# Patient Record
Sex: Male | Born: 1950 | Race: White | Hispanic: No | Marital: Married | Smoking: Never smoker
Health system: Southern US, Community
[De-identification: ages and names within clinical notes are randomized; demographics above are authoritative.]

## PROBLEM LIST (undated history)

## (undated) DIAGNOSIS — R569 Unspecified convulsions: Secondary | ICD-10-CM

## (undated) DIAGNOSIS — M1990 Unspecified osteoarthritis, unspecified site: Secondary | ICD-10-CM

## (undated) DIAGNOSIS — F329 Major depressive disorder, single episode, unspecified: Secondary | ICD-10-CM

## (undated) DIAGNOSIS — I10 Essential (primary) hypertension: Secondary | ICD-10-CM

## (undated) DIAGNOSIS — F79 Unspecified intellectual disabilities: Secondary | ICD-10-CM

## (undated) DIAGNOSIS — E119 Type 2 diabetes mellitus without complications: Secondary | ICD-10-CM

## (undated) DIAGNOSIS — R51 Headache: Secondary | ICD-10-CM

## (undated) DIAGNOSIS — F32A Depression, unspecified: Secondary | ICD-10-CM

## (undated) DIAGNOSIS — C801 Malignant (primary) neoplasm, unspecified: Secondary | ICD-10-CM

## (undated) HISTORY — PX: HERNIA REPAIR: SHX51

---

## 1999-02-25 ENCOUNTER — Ambulatory Visit (HOSPITAL_COMMUNITY): Admission: RE | Admit: 1999-02-25 | Discharge: 1999-02-25 | Payer: Self-pay | Admitting: Emergency Medicine

## 2000-07-08 ENCOUNTER — Emergency Department (HOSPITAL_COMMUNITY): Admission: EM | Admit: 2000-07-08 | Discharge: 2000-07-08 | Payer: Self-pay | Admitting: Emergency Medicine

## 2002-04-17 ENCOUNTER — Encounter: Admission: RE | Admit: 2002-04-17 | Discharge: 2002-07-16 | Payer: Self-pay | Admitting: Emergency Medicine

## 2002-12-10 ENCOUNTER — Encounter: Admission: RE | Admit: 2002-12-10 | Discharge: 2002-12-10 | Payer: Self-pay | Admitting: Emergency Medicine

## 2002-12-10 ENCOUNTER — Encounter: Payer: Self-pay | Admitting: Emergency Medicine

## 2004-02-03 ENCOUNTER — Encounter: Admission: RE | Admit: 2004-02-03 | Discharge: 2004-02-03 | Payer: Self-pay | Admitting: Emergency Medicine

## 2004-06-07 ENCOUNTER — Encounter: Admission: RE | Admit: 2004-06-07 | Discharge: 2004-06-07 | Payer: Self-pay | Admitting: Emergency Medicine

## 2008-12-03 ENCOUNTER — Emergency Department (HOSPITAL_COMMUNITY): Admission: EM | Admit: 2008-12-03 | Discharge: 2008-12-03 | Payer: Self-pay | Admitting: Emergency Medicine

## 2009-06-22 ENCOUNTER — Emergency Department (HOSPITAL_COMMUNITY): Admission: EM | Admit: 2009-06-22 | Discharge: 2009-06-22 | Payer: Self-pay | Admitting: Emergency Medicine

## 2010-08-30 LAB — COMPREHENSIVE METABOLIC PANEL
ALT: 19 U/L (ref 0–53)
AST: 23 U/L (ref 0–37)
Albumin: 4.5 g/dL (ref 3.5–5.2)
Calcium: 10 mg/dL (ref 8.4–10.5)
GFR calc Af Amer: >60 mL/min (ref >60)
Sodium: 135 mEq/L (ref 135–145)
Total Protein: 8 g/dL (ref 6.0–8.3)

## 2010-08-30 LAB — CBC
MCHC: 32.7 g/dL (ref 30.0–36.0)
Platelets: 258 10*3/uL (ref 150–400)
RBC: 5.02 MIL/uL (ref 4.22–5.81)
RDW: 17.5 % — ABNORMAL HIGH (ref 11.5–15.5)

## 2010-08-30 LAB — DIFFERENTIAL
Eosinophils Absolute: 0.1 10*3/uL (ref 0.0–0.7)
Eosinophils Relative: 2 % (ref 0–5)
Lymphocytes Relative: 39 % (ref 12–46)
Lymphs Abs: 3.1 10*3/uL (ref 0.7–4.0)
Monocytes Relative: 11 % (ref 3–12)

## 2010-08-30 LAB — URINALYSIS, ROUTINE W REFLEX MICROSCOPIC
Ketones, ur: NEGATIVE mg/dL
Nitrite: NEGATIVE
Protein, ur: NEGATIVE mg/dL
Urobilinogen, UA: 0.2 mg/dL (ref 0.0–1.0)
pH: 6.5 (ref 5.0–8.0)

## 2010-08-30 LAB — POCT I-STAT, CHEM 8
Calcium, Ion: 1.14 mmol/L (ref 1.12–1.32)
Chloride: 106 mEq/L (ref 96–112)
HCT: 43 % (ref 39.0–52.0)
Potassium: 3.9 mEq/L (ref 3.5–5.1)

## 2012-03-30 ENCOUNTER — Emergency Department (HOSPITAL_COMMUNITY)
Admission: EM | Admit: 2012-03-30 | Discharge: 2012-03-30 | Disposition: A | Discharge status: Home / Self Care | Payer: Medicare Other | Source: Home / Self Care | Attending: Emergency Medicine | Admitting: Emergency Medicine

## 2012-03-30 ENCOUNTER — Emergency Department (HOSPITAL_COMMUNITY): Payer: Medicare Other

## 2012-03-30 ENCOUNTER — Encounter (HOSPITAL_COMMUNITY): Payer: Self-pay | Admitting: Emergency Medicine

## 2012-03-30 DIAGNOSIS — E119 Type 2 diabetes mellitus without complications: Secondary | ICD-10-CM | POA: Insufficient documentation

## 2012-03-30 DIAGNOSIS — R569 Unspecified convulsions: Secondary | ICD-10-CM

## 2012-03-30 DIAGNOSIS — M129 Arthropathy, unspecified: Secondary | ICD-10-CM | POA: Insufficient documentation

## 2012-03-30 DIAGNOSIS — F79 Unspecified intellectual disabilities: Secondary | ICD-10-CM | POA: Insufficient documentation

## 2012-03-30 DIAGNOSIS — F3289 Other specified depressive episodes: Secondary | ICD-10-CM | POA: Insufficient documentation

## 2012-03-30 DIAGNOSIS — Z79899 Other long term (current) drug therapy: Secondary | ICD-10-CM | POA: Insufficient documentation

## 2012-03-30 DIAGNOSIS — I10 Essential (primary) hypertension: Secondary | ICD-10-CM | POA: Insufficient documentation

## 2012-03-30 DIAGNOSIS — F329 Major depressive disorder, single episode, unspecified: Secondary | ICD-10-CM | POA: Insufficient documentation

## 2012-03-30 LAB — URINALYSIS, ROUTINE W REFLEX MICROSCOPIC
Hgb urine dipstick: NEGATIVE
Nitrite: NEGATIVE
Specific Gravity, Urine: 1.019 (ref 1.005–1.030)
Urobilinogen, UA: 1 mg/dL (ref 0.0–1.0)
pH: 5.5 (ref 5.0–8.0)

## 2012-03-30 LAB — CBC WITH DIFFERENTIAL/PLATELET
Basophils Absolute: 0 10*3/uL (ref 0.0–0.1)
Basophils Relative: 0 % (ref 0–1)
Eosinophils Relative: 1 % (ref 0–5)
HCT: 39.8 % (ref 39.0–52.0)
Hemoglobin: 12.8 g/dL — ABNORMAL LOW (ref 13.0–17.0)
Lymphocytes Relative: 32 % (ref 12–46)
MCHC: 32.2 g/dL (ref 30.0–36.0)
MCV: 82.1 fL (ref 78.0–100.0)
Monocytes Absolute: 1.1 10*3/uL — ABNORMAL HIGH (ref 0.1–1.0)
Monocytes Relative: 12 % (ref 3–12)
RDW: 16.1 % — ABNORMAL HIGH (ref 11.5–15.5)

## 2012-03-30 LAB — BASIC METABOLIC PANEL
Chloride: 99 mEq/L (ref 96–112)
GFR calc non Af Amer: >90 mL/min (ref >90)
Glucose, Bld: 183 mg/dL — ABNORMAL HIGH (ref 70–99)
Potassium: 4.7 mEq/L (ref 3.5–5.1)
Sodium: 135 mEq/L (ref 135–145)

## 2012-03-30 MED ORDER — LORAZEPAM 2 MG/ML IJ SOLN
INTRAMUSCULAR | Status: AC
  Filled 2012-03-30: qty 1

## 2012-03-30 NOTE — ED Provider Notes (Signed)
History     CSN: [id number]  Arrival date & time 03/30/12  1245   First MD Initiated Contact with Patient 03/30/12 1419      Chief Complaint  Patient presents with  . Shaking    (Consider location/radiation/quality/duration/timing/severity/associated sxs/prior treatment) HPI Comments: Patient with a history of MR, type II DM, and depression presents today after family had what they thought was a seizure.  They report that earlier today they noticed jerking of both of the patient's arms and his head.  They report that he does have a history of a tremor, but that this was different.  They do not think that he lost consciousness, but he did not respond to their questions during this episode.  They report that this jerking lasted for approximately 60 seconds and then after that he had a fine tremor.  They thought that possibly his blood sugar was low.  They did not check it, but gave him food and his symptoms seemed to improve.   No loss of bowel or bladder incontinence with this episode.  Patient denies any pain at this time.  Family reports that he seems to be back to his baseline at this time.  He does not have prior history of Seizure Disorder.  Family reports that he has not had any recent illnesses.    History provided by: sister and mother.    History reviewed. No pertinent past medical history.  History reviewed. No pertinent past surgical history.  History reviewed. No pertinent family history.  History  Substance Use Topics  . Smoking status: Not on file  . Smokeless tobacco: Not on file  . Alcohol Use: Not on file      Review of Systems  Unable to perform ROS: Other    Allergies  Review of patient's allergies indicates no known allergies.  Home Medications   Current Outpatient Rx  Name  Route  Sig  Dispense  Refill  . DIVALPROEX SODIUM 125 MG PO TBEC   Oral   Take 125 mg by mouth 2 (two) times daily.         . FENOFIBRATE 145 MG PO TABS   Oral   Take 145  mg by mouth daily.         . OMEGA-3 FATTY ACIDS 1000 MG PO CAPS   Oral   Take 1 g by mouth daily.         Marland Kitchen GLIPIZIDE ER 5 MG PO TB24   Oral   Take 5 mg by mouth daily.         Marland Kitchen KETOCONAZOLE 2 % EX SHAM   Topical   Apply 1 application topically 2 (two) times a week.         Marland Kitchen LISINOPRIL 2.5 MG PO TABS   Oral   Take 2.5 mg by mouth daily.         Marland Kitchen PAROXETINE HCL 20 MG PO TABS   Oral   Take 20 mg by mouth daily.         Marland Kitchen PIOGLITAZONE HCL 30 MG PO TABS   Oral   Take 30 mg by mouth daily.         Marland Kitchen PRIMIDONE 250 MG PO TABS   Oral   Take 250 mg by mouth at bedtime.         Marland Kitchen SALICYLIC ACID 3 % EX SHAM   Topical   Apply 1 application topically once a week.         Marland Kitchen  SITAGLIPTIN-METFORMIN HCL 50-1000 MG PO TABS   Oral   Take 1 tablet by mouth 2 (two) times daily with a meal.           BP 133/67  Pulse 100  Temp 98.4 F (36.9 C) (Oral)  Resp 24  SpO2 98%  Physical Exam  Nursing note and vitals reviewed. Constitutional: He appears well-developed and well-nourished. No distress.  HENT:  Head: Normocephalic and atraumatic.  Mouth/Throat: Oropharynx is clear and moist.  Eyes: EOM are normal. Pupils are equal, round, and reactive to light.  Neck: Normal range of motion. Neck supple.  Cardiovascular: Normal rate, regular rhythm and normal heart sounds.   Pulmonary/Chest: Effort normal and breath sounds normal.  Neurological: He is alert. He has normal strength. No cranial nerve deficit.       Unsteady gait, which family reports is at baseline Patient orientated and knows that he is in the hospital  Skin: Skin is warm and dry. He is not diaphoretic.  Psychiatric: He has a normal mood and affect.    ED Course  Procedures (including critical care time)  Labs Reviewed  GLUCOSE, CAPILLARY - Abnormal; Notable for the following:    Glucose-Capillary 186 (*)     All other components within normal limits   Ct Head Wo Contrast  03/30/2012   *RADIOLOGY REPORT*  Clinical Data: Shaking, concern for hypoglycemia  CT HEAD WITHOUT CONTRAST  Technique:  Contiguous axial images were obtained from the base of the skull through the vertex without contrast.  Comparison: 12/03/2008  Findings:  Examination is minimally degraded secondary to patient motion artifact necessitating acquisition of additional images through the base of the skull.  Grossly unchanged scattered mild periventricular hypodensity suggesting microvascular ischemic disease.  Wallace Cullens white differentiation is otherwise well maintained without CT evidence of acute large territory infarct.  No intraparenchymal or extra-axial mass or hemorrhage.  Normal size and configuration of the ventricles and basilar cisterns.  No midline shift.  Limited visualization of the paranasal sinuses and mastoid air cells is normal.  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   Original Report Authenticated By: Tacey Ruiz, MD      No diagnosis found.   Date: 03/30/2012  Rate: 99  Rhythm: normal sinus rhythm  QRS Axis: normal  Intervals: normal  ST/T Wave abnormalities: nonspecific T wave changes  Conduction Disutrbances:none  Narrative Interpretation:   Old EKG Reviewed: unchanged    MDM  Patient presents today after having what family thought was a seizure.  It is unclear if this was actually a seizure.  Patient does not have a history of Seizure Disorder.  Labs unremarkable.  No acute findings on Head CT.  Patient back at baseline and asymptomatic at time of discharge.  Therefore, feel that patient can be discharged home.        Pascal Lux Dunnavant, PA-C 03/31/12 0930

## 2012-03-30 NOTE — ED Notes (Signed)
ZOX:[id number]<AV> Expected date:03/30/12<BR> Expected time:12:12 PM<BR> Means of arrival:Ambulance<BR> Comments:<BR> Seizure like activity/MR

## 2012-03-30 NOTE — ED Notes (Signed)
I placed the patients t shirt, jeans, glasses, socks, boots into a patient belongings bag

## 2012-03-30 NOTE — ED Notes (Addendum)
.   While patient working in the shed around  1015 given peanut butter, & ensure due to patient acting like sugar was low (dizzy, shaking, and gait unsteady). Patient had a total of 3 different events of acting like sugar dropped. Then family got concern around 1130am sitting on the lawn mower in a dazy/starring/shanking therefore EMS called.  General appearance is a patient quiet & not conversing. No seizure activity noted. However, left jaw area is swollen.

## 2012-03-30 NOTE — ED Notes (Signed)
UEA:[id number]<WJ> Expected date:03/30/12<BR> Expected time:12:23 PM<BR> Means of arrival:Ambulance<BR> Comments:<BR> seizures

## 2012-03-31 ENCOUNTER — Inpatient Hospital Stay (HOSPITAL_COMMUNITY)
Admission: EM | Admit: 2012-03-31 | Discharge: 2012-04-05 | DRG: 093 | Disposition: A | Discharge status: Skilled Nursing Facility | Payer: Medicare Other | Attending: Internal Medicine | Admitting: Internal Medicine

## 2012-03-31 ENCOUNTER — Encounter (HOSPITAL_COMMUNITY): Payer: Self-pay

## 2012-03-31 DIAGNOSIS — Z66 Do not resuscitate: Secondary | ICD-10-CM | POA: Diagnosis present

## 2012-03-31 DIAGNOSIS — E785 Hyperlipidemia, unspecified: Secondary | ICD-10-CM | POA: Diagnosis present

## 2012-03-31 DIAGNOSIS — E119 Type 2 diabetes mellitus without complications: Secondary | ICD-10-CM

## 2012-03-31 DIAGNOSIS — R42 Dizziness and giddiness: Secondary | ICD-10-CM

## 2012-03-31 DIAGNOSIS — F71 Moderate intellectual disabilities: Secondary | ICD-10-CM | POA: Diagnosis present

## 2012-03-31 DIAGNOSIS — F79 Unspecified intellectual disabilities: Secondary | ICD-10-CM

## 2012-03-31 DIAGNOSIS — R259 Unspecified abnormal involuntary movements: Principal | ICD-10-CM

## 2012-03-31 DIAGNOSIS — Z79899 Other long term (current) drug therapy: Secondary | ICD-10-CM

## 2012-03-31 DIAGNOSIS — I10 Essential (primary) hypertension: Secondary | ICD-10-CM | POA: Diagnosis present

## 2012-03-31 DIAGNOSIS — R4587 Impulsiveness: Secondary | ICD-10-CM | POA: Diagnosis present

## 2012-03-31 DIAGNOSIS — IMO0002 Reserved for concepts with insufficient information to code with codable children: Secondary | ICD-10-CM | POA: Diagnosis present

## 2012-03-31 DIAGNOSIS — R569 Unspecified convulsions: Secondary | ICD-10-CM

## 2012-03-31 HISTORY — DX: Major depressive disorder, single episode, unspecified: F32.9

## 2012-03-31 HISTORY — DX: Unspecified osteoarthritis, unspecified site: M19.90

## 2012-03-31 HISTORY — DX: Unspecified convulsions: R56.9

## 2012-03-31 HISTORY — DX: Malignant (primary) neoplasm, unspecified: C80.1

## 2012-03-31 HISTORY — DX: Headache: R51

## 2012-03-31 HISTORY — DX: Essential (primary) hypertension: I10

## 2012-03-31 HISTORY — DX: Type 2 diabetes mellitus without complications: E11.9

## 2012-03-31 HISTORY — DX: Unspecified intellectual disabilities: F79

## 2012-03-31 HISTORY — DX: Depression, unspecified: F32.A

## 2012-03-31 LAB — ETHANOL: Alcohol, Ethyl (B): <11 mg/dL (ref 0–11)

## 2012-03-31 LAB — COMPREHENSIVE METABOLIC PANEL
ALT: 13 U/L (ref 0–53)
Albumin: 3.9 g/dL (ref 3.5–5.2)
Alkaline Phosphatase: 45 U/L (ref 39–117)
Glucose, Bld: 169 mg/dL — ABNORMAL HIGH (ref 70–99)
Potassium: 4 mEq/L (ref 3.5–5.1)
Sodium: 136 mEq/L (ref 135–145)
Total Protein: 7.5 g/dL (ref 6.0–8.3)

## 2012-03-31 LAB — GLUCOSE, CAPILLARY
Glucose-Capillary: 107 mg/dL — ABNORMAL HIGH (ref 70–99)
Glucose-Capillary: 143 mg/dL — ABNORMAL HIGH (ref 70–99)
Glucose-Capillary: 152 mg/dL — ABNORMAL HIGH (ref 70–99)

## 2012-03-31 LAB — CBC WITH DIFFERENTIAL/PLATELET
Basophils Absolute: 0.1 10*3/uL (ref 0.0–0.1)
Basophils Relative: 1 % (ref 0–1)
MCHC: 32.5 g/dL (ref 30.0–36.0)
Monocytes Absolute: 0.8 10*3/uL (ref 0.1–1.0)
Neutro Abs: 4.3 10*3/uL (ref 1.7–7.7)
Neutrophils Relative %: 58 % (ref 43–77)
Platelets: 231 10*3/uL (ref 150–400)
RDW: 16 % — ABNORMAL HIGH (ref 11.5–15.5)

## 2012-03-31 LAB — URINALYSIS, ROUTINE W REFLEX MICROSCOPIC
Bilirubin Urine: NEGATIVE
Nitrite: NEGATIVE
Specific Gravity, Urine: 1.02 (ref 1.005–1.030)
Urobilinogen, UA: 1 mg/dL (ref 0.0–1.0)

## 2012-03-31 LAB — RAPID URINE DRUG SCREEN, HOSP PERFORMED
Amphetamines: NOT DETECTED
Tetrahydrocannabinol: NOT DETECTED

## 2012-03-31 LAB — TSH: TSH: 1.874 u[IU]/mL (ref 0.350–4.500)

## 2012-03-31 LAB — CK: Total CK: 74 U/L (ref 7–232)

## 2012-03-31 LAB — CBC
Platelets: 268 10*3/uL (ref 150–400)
RDW: 16.1 % — ABNORMAL HIGH (ref 11.5–15.5)
WBC: 8.9 10*3/uL (ref 4.0–10.5)

## 2012-03-31 LAB — VITAMIN B12: Vitamin B-12: 650 pg/mL (ref 211–911)

## 2012-03-31 LAB — VALPROIC ACID LEVEL: Valproic Acid Lvl: 14.9 ug/mL — ABNORMAL LOW (ref 50.0–100.0)

## 2012-03-31 LAB — CREATININE, SERUM: Creatinine, Ser: 0.59 mg/dL (ref 0.50–1.35)

## 2012-03-31 MED ORDER — PIOGLITAZONE HCL 30 MG PO TABS
30.0000 mg | ORAL_TABLET | Freq: Every day | ORAL | Status: DC
  Administered 2012-04-01 – 2012-04-05 (×5): 30 mg via ORAL
  Filled 2012-03-31 (×7): qty 1

## 2012-03-31 MED ORDER — INSULIN ASPART 100 UNIT/ML ~~LOC~~ SOLN
0.0000 [IU] | Freq: Three times a day (TID) | SUBCUTANEOUS | Status: DC
  Administered 2012-03-31: 1 [IU] via SUBCUTANEOUS
  Administered 2012-04-01 – 2012-04-02 (×4): 2 [IU] via SUBCUTANEOUS
  Administered 2012-04-02: 1 [IU] via SUBCUTANEOUS
  Administered 2012-04-02 – 2012-04-04 (×5): 2 [IU] via SUBCUTANEOUS
  Administered 2012-04-04: 1 [IU] via SUBCUTANEOUS
  Administered 2012-04-04: 2 [IU] via SUBCUTANEOUS
  Administered 2012-04-05: 3 [IU] via SUBCUTANEOUS
  Administered 2012-04-05: 2 [IU] via SUBCUTANEOUS

## 2012-03-31 MED ORDER — FENOFIBRATE 160 MG PO TABS
160.0000 mg | ORAL_TABLET | Freq: Every day | ORAL | Status: DC
  Administered 2012-04-01 – 2012-04-05 (×5): 160 mg via ORAL
  Filled 2012-03-31 (×5): qty 1

## 2012-03-31 MED ORDER — DIVALPROEX SODIUM 125 MG PO DR TAB
125.0000 mg | DELAYED_RELEASE_TABLET | Freq: Two times a day (BID) | ORAL | Status: DC
  Administered 2012-03-31: 125 mg via ORAL
  Filled 2012-03-31 (×3): qty 1

## 2012-03-31 MED ORDER — ACETAMINOPHEN 325 MG PO TABS
650.0000 mg | ORAL_TABLET | Freq: Four times a day (QID) | ORAL | Status: DC | PRN
  Administered 2012-03-31: 650 mg via ORAL
  Filled 2012-03-31 (×2): qty 2

## 2012-03-31 MED ORDER — SODIUM CHLORIDE 0.9 % IJ SOLN
3.0000 mL | Freq: Two times a day (BID) | INTRAMUSCULAR | Status: DC
  Administered 2012-03-31 – 2012-04-04 (×8): 3 mL via INTRAVENOUS

## 2012-03-31 MED ORDER — PRIMIDONE 250 MG PO TABS
250.0000 mg | ORAL_TABLET | Freq: Every day | ORAL | Status: DC
  Administered 2012-03-31 – 2012-04-04 (×5): 250 mg via ORAL
  Filled 2012-03-31 (×7): qty 1

## 2012-03-31 MED ORDER — ENOXAPARIN SODIUM 40 MG/0.4ML ~~LOC~~ SOLN
40.0000 mg | SUBCUTANEOUS | Status: DC
  Administered 2012-03-31 – 2012-04-04 (×5): 40 mg via SUBCUTANEOUS
  Filled 2012-03-31 (×6): qty 0.4

## 2012-03-31 MED ORDER — PAROXETINE HCL 20 MG PO TABS
20.0000 mg | ORAL_TABLET | Freq: Every day | ORAL | Status: DC
  Administered 2012-04-01 – 2012-04-05 (×5): 20 mg via ORAL
  Filled 2012-03-31 (×5): qty 1

## 2012-03-31 MED ORDER — LORAZEPAM 2 MG/ML IJ SOLN
1.0000 mg | Freq: Once | INTRAMUSCULAR | Status: AC
  Administered 2012-03-31: 1 mg via INTRAVENOUS
  Filled 2012-03-31: qty 1

## 2012-03-31 MED ORDER — LISINOPRIL 2.5 MG PO TABS
2.5000 mg | ORAL_TABLET | Freq: Every day | ORAL | Status: DC
  Administered 2012-04-01 – 2012-04-05 (×5): 2.5 mg via ORAL
  Filled 2012-03-31 (×5): qty 1

## 2012-03-31 NOTE — ED Notes (Signed)
Was dc'd from [facility] last night. Sister says he lives with her- got up and ate breakfast. Was going to drink a cup of coffee and patient started shaking and thrashing and beating the table. Would quit and start again 30 seconds later. EMT states no episodes on the way and that previous trip appeared to be pseudo-seizures. Patient is sitting in bed. No episodes noted. Alert. Sister asked him how he felt and he stated he has a headache and the room is spinning around.

## 2012-03-31 NOTE — Progress Notes (Signed)
A 61 yrs old white male admmitted from home through [facility] with Dx seizures .Alert  Awake and follows command has history of Mental retardation  .   Admission history and neuro assessments   Completed. No seizure activity noted at present vital signs stable   Seizure precaution in place . Will continue to monitor.

## 2012-03-31 NOTE — Consult Note (Signed)
Reason for Consult:Shaking episode Referring Physician: Lynelle Doctor  CC: 20 minutes of shaking  HPI: Todd Padilla is an 61 y.o. male who lives with his sister.  Today awakened normal.  After eating breakfast started to have thrashing of his arms and legs.  Head was back.  Eyes were open.  Patient did not respond.  Episodes of shaking last 30 to 120 seconds but were repeated and in total lasted .  EMS was called.  No episodes were noted en route.  Although the patient had his tongue sticking out there was no tongue biting and no bowel or bladder incontinence.  Patient does describe headache and dizziness.  On yesterday as seen at [facility] due to multiple episodes of feeling as if his sugar was dropping.  Then had a spell of shaking as well.  Sister reports that he did have a seizure about 2 years ago.  Work up was unremarkable and patient was not started on medications.   Patient has issues with his spine and hip.  This affects his gait at baseline.  The patient uses a walker at times and at other times uses a cane.  His gait is unsteady.  Per her report his gait is better on some days than it is on others based on his pain that day but overall is progressively worsening.   Patient has had a tremor for years that has been worsening.  It does not interfere with his activities of daily living.    Past medical history:  MR, spine curvature, diabetes, hypercholesterolemia, hypertension  Past surgical history:  None  Family history: Mother deceased.  Father and paternal aunt with Alzheimers  Social History:  No history of alcohol, tobacco or illicit drug abuse.  Lives with his sister.    No Known Allergies  Medications: I have reviewed the patient's current medications. Prior to Admission:   Current outpatient prescriptions:divalproex (DEPAKOTE) 125 MG DR tablet, Take 125 mg by mouth 2 (two) times daily., Disp: , Rfl: ;  fenofibrate (TRICOR) 145 MG tablet, Take 145 mg by mouth daily., Disp: , Rfl: ;   fish oil-omega-3 fatty acids 1000 MG capsule, Take 1 g by mouth daily., Disp: , Rfl: ;  glipiZIDE (GLUCOTROL XL) 5 MG 24 hr tablet, Take 5 mg by mouth daily., Disp: , Rfl:  ketoconazole (NIZORAL) 2 % shampoo, Apply 1 application topically 2 (two) times a week., Disp: , Rfl: ;  lisinopril (PRINIVIL,ZESTRIL) 2.5 MG tablet, Take 2.5 mg by mouth daily., Disp: , Rfl: ;  PARoxetine (PAXIL) 20 MG tablet, Take 20 mg by mouth daily., Disp: , Rfl: ;  pioglitazone (ACTOS) 30 MG tablet, Take 30 mg by mouth daily., Disp: , Rfl: ;  primidone (MYSOLINE) 250 MG tablet, Take 250 mg by mouth at bedtime., Disp: , Rfl:  Salicylic Acid (DENOREX EXTRA STRENGTH) 3 % SHAM, Apply 1 application topically 2 (two) times a week. Tues and Thurs only, Disp: , Rfl: ;  sitaGLIPtan-metformin (JANUMET) 50-1000 MG per tablet, Take 1 tablet by mouth 2 (two) times daily with a meal., Disp: , Rfl:  Facility-Administered Medications Ordered in Other Encounters: [DISCONTINUED] LORazepam (ATIVAN) 2 MG/ML injection, , , ,   ROS: History obtained from sister  General ROS: negative for - chills, fatigue, fever, night sweats, weight gain or weight loss Psychological ROS: negative for - behavioral disorder, hallucinations, memory difficulties, mood swings or suicidal ideation Ophthalmic ROS: negative for - blurry vision, double vision, eye pain or loss of vision ENT ROS: negative for -  epistaxis, nasal discharge, oral lesions, sore throat, tinnitus  Allergy and Immunology ROS: negative for - hives or itchy/watery eyes Hematological and Lymphatic ROS: negative for - bleeding problems, bruising or swollen lymph nodes Endocrine ROS: negative for - galactorrhea, hair pattern changes, polydipsia/polyuria or temperature intolerance Respiratory ROS: negative for - cough, hemoptysis, shortness of breath or wheezing Cardiovascular ROS: negative for - chest pain, dyspnea on exertion, edema or irregular heartbeat Gastrointestinal ROS: negative for -  abdominal pain, diarrhea, hematemesis, nausea/vomiting or stool incontinence Genito-Urinary ROS: negative for - dysuria, hematuria, incontinence or urinary frequency/urgency Musculoskeletal ROS: negative for - joint swelling or muscular weakness Neurological ROS: as noted in HPI Dermatological ROS: negative for rash and skin lesion changes  Physical Examination: Blood pressure 125/87, pulse 115, temperature 98.1 F (36.7 C), temperature source Oral, resp. rate 19, SpO2 96.00%.  Neurologic Examination Mental Status: Alert.  Speaks in mostly one-word sentences.  On occasion will put together a longer sentence.  Dysarthric.  Follows simple commands.  Cranial Nerves: II: Discs flat bilaterally; Visual fields grossly normal, pupils equal, round, reactive to light and accommodation III,IV, VI: ptosis not present, extra-ocular motions intact bilaterally V,VII: smile symmetric, facial light touch sensation normal bilaterally VIII: hearing normal bilaterally IX,X: gag reflex present XI: bilateral shoulder shrug XII: midline tongue extension Motor: Right : Upper extremity   5/5    Left:     Upper extremity   5/5  Lower extremity   5/5     Lower extremity   5/5 Tone and bulk:atrophy noted in the hand intrinsics bilaterally and in the distal lower extremities.  Arches high.  Coarse tremor of the bilateral upper extremities.  Usually seen with action but inconsistent.   Sensory: Responds to stimuli throughout Deep Tendon Reflexes: 3+ and symmetric with absent AJ's bilaterally Plantars: Right: upgoing   Left: upgoing Cerebellar: normal finger-to-nose and normal heel-to-shin test CV: pulses palpable throughout   Laboratory Studies:   Basic Metabolic Panel:  Lab 03/31/12 4540 03/30/12 1340  NA 136 135  K 4.0 4.7  CL 99 99  CO2 28 23  GLUCOSE 169* 183*  BUN 23 25*  CREATININE 0.69 0.47*  CALCIUM 9.9 9.9  MG -- --  PHOS -- --    Liver Function Tests:  Lab 03/31/12 1000  AST 15  ALT  13  ALKPHOS 45  BILITOT 0.4  PROT 7.5  ALBUMIN 3.9   No results found for this basename: LIPASE:5,AMYLASE:5 in the last 168 hours No results found for this basename: AMMONIA:3 in the last 168 hours  CBC:  Lab 03/31/12 1000 03/30/12 1340  WBC 7.5 8.7  NEUTROABS 4.3 4.7  HGB 12.5* 12.8*  HCT 38.5* 39.8  MCV 81.7 82.1  PLT 231 269    Cardiac Enzymes: No results found for this basename: CKTOTAL:5,CKMB:5,CKMBINDEX:5,TROPONINI:5 in the last 168 hours  BNP: No components found with this basename: POCBNP:5  CBG:  Lab 03/31/12 1152 03/31/12 0955 03/30/12 1314  GLUCAP 107* 171* 186*    Microbiology: No results found for this or any previous visit.  Coagulation Studies: No results found for this basename: LABPROT:5,INR:5 in the last 72 hours  Urinalysis:  Lab 03/31/12 1206 03/30/12 1448  COLORURINE YELLOW YELLOW  LABSPEC 1.020 1.019  PHURINE 6.5 5.5  GLUCOSEU NEGATIVE 500*  HGBUR NEGATIVE NEGATIVE  BILIRUBINUR NEGATIVE NEGATIVE  KETONESUR NEGATIVE NEGATIVE  PROTEINUR NEGATIVE NEGATIVE  UROBILINOGEN 1.0 1.0  NITRITE NEGATIVE NEGATIVE  LEUKOCYTESUR NEGATIVE NEGATIVE    Lipid Panel:  No results found  for this basename: chol, trig, hdl, cholhdl, vldl, ldlcalc    HgbA1C:  No results found for this basename: HGBA1C    Urine Drug Screen:     Component Value Date/Time   LABOPIA NONE DETECTED 03/31/2012 1208   COCAINSCRNUR NONE DETECTED 03/31/2012 1208   LABBENZ NONE DETECTED 03/31/2012 1208   AMPHETMU NONE DETECTED 03/31/2012 1208   THCU NONE DETECTED 03/31/2012 1208   LABBARB POSITIVE* 03/31/2012 1208    Alcohol Level:  Lab 03/31/12 1000  ETH <11    Imaging: Ct Head Wo Contrast  03/30/2012  *RADIOLOGY REPORT*  Clinical Data: Shaking, concern for hypoglycemia  CT HEAD WITHOUT CONTRAST  Technique:  Contiguous axial images were obtained from the base of the skull through the vertex without contrast.  Comparison: 12/03/2008  Findings:  Examination is minimally  degraded secondary to patient motion artifact necessitating acquisition of additional images through the base of the skull.  Grossly unchanged scattered mild periventricular hypodensity suggesting microvascular ischemic disease.  Wallace Cullens white differentiation is otherwise well maintained without CT evidence of acute large territory infarct.  No intraparenchymal or extra-axial mass or hemorrhage.  Normal size and configuration of the ventricles and basilar cisterns.  No midline shift.  Limited visualization of the paranasal sinuses and mastoid air cells is normal.  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   Original Report Authenticated By: Tacey Ruiz, MD      Assessment: 61 year old male presenting with a 20 minute episode of shaking.  Etiology unclear.  Patient with normal lab work and is at baseline functional status other than an increase in his baseline tremor.  I am not yet convinced that this is seizure.  Further work up indicated.    Plan: 1.  EEG 2.  MRI of  The brain.  Sister reports that he will need sedation.   3.  ESR, TSH, B12, Mg, Phos, Calcium, CK  Case discussed with Dr. Maryellen Pile, MD [facility] [phone] 03/31/2012, 1:41 PM

## 2012-03-31 NOTE — H&P (Signed)
[facility]          History and Physical    PCP:   GATES,Sederick NEVILL, MD   Chief Complaint:  shakes  HPI: Todd Padilla is a 61/M with history of moderate MR, DM, HTN who lives with his sister was brought to the ER Yesterday and today with the above complaint. She brought him to the ER yesterday after an episode of shaking that involved jerky movements of his arms and head, this may have lasted less than 1 min, he was worked up in the ER and sent back home. Today after breakfast he started experiencing Jerky/Thrashing movement involving both arms and legs and head arching backwards, no eye rolling, tongue biting, bowel or bladder incontinence. He had a blank stare during these episodes, each episode lasted around a minute and he had 6-8 episodes of this. After this he was tired and complained of a headache and dizziness. No Loss of consciousness after the episodes. No h/o trauma, no fevers/chills, no recent changes in medications  Allergies:  No Known Allergies  past medical history on file. DM HTN Moderate MR Dyslipidemia  No past surgical history on file.  Prior to Admission medications   Medication Sig Start Date End Date Taking? Authorizing Provider  divalproex (DEPAKOTE) 125 MG DR tablet Take 125 mg by mouth 2 (two) times daily.   Yes Historical Provider, MD  fenofibrate (TRICOR) 145 MG tablet Take 145 mg by mouth daily.   Yes Historical Provider, MD  fish oil-omega-3 fatty acids 1000 MG capsule Take 1 g by mouth daily.   Yes Historical Provider, MD  glipiZIDE (GLUCOTROL XL) 5 MG 24 hr tablet Take 5 mg by mouth daily.   Yes Historical Provider, MD  ketoconazole (NIZORAL) 2 % shampoo Apply 1 application topically 2 (two) times a week.   Yes Historical Provider, MD  lisinopril (PRINIVIL,ZESTRIL) 2.5 MG tablet Take 2.5 mg by mouth daily.   Yes Historical Provider, MD  PARoxetine (PAXIL) 20 MG tablet Take 20 mg by mouth daily.   Yes Historical Provider, MD    pioglitazone (ACTOS) 30 MG tablet Take 30 mg by mouth daily.   Yes Historical Provider, MD  primidone (MYSOLINE) 250 MG tablet Take 250 mg by mouth at bedtime.   Yes Historical Provider, MD  Salicylic Acid (DENOREX EXTRA STRENGTH) 3 % SHAM Apply 1 application topically 2 (two) times a week. Tues and Thurs only   Yes Historical Provider, MD  sitaGLIPtan-metformin (JANUMET) 50-1000 MG per tablet Take 1 tablet by mouth 2 (two) times daily with a meal.   Yes Historical Provider, MD    Social History: lives at home with sister, functional/independent at baseline  does not have a smoking history on file. He does not have any smokeless tobacco history on file. His alcohol and drug histories not on file.  History reviewed. No pertinent family history.  Review of Systems:  Constitutional: Denies fever, chills, diaphoresis, appetite change and fatigue.  HEENT: Denies photophobia, eye pain, redness, hearing loss, ear pain, congestion, sore throat, rhinorrhea, sneezing, mouth sores, trouble swallowing, neck pain, neck stiffness and tinnitus.   Respiratory: Denies SOB, DOE, cough, chest tightness,  and wheezing.   Cardiovascular: Denies chest pain, palpitations and leg swelling.  Gastrointestinal: Denies nausea, vomiting, abdominal pain, diarrhea, constipation, blood in stool and abdominal distention.  Genitourinary: Denies dysuria, urgency, frequency, hematuria, flank pain and difficulty urinating.  Musculoskeletal: Denies myalgias, back pain, joint swelling, arthralgias and gait problem.  Skin: Denies pallor, rash and wound.  Neurological: Denies dizziness, seizures, syncope, weakness, light-headedness, numbness and headaches.  Hematological: Denies adenopathy. Easy bruising, personal or family bleeding history  Psychiatric/Behavioral: Denies suicidal ideation, mood changes, confusion, nervousness, sleep disturbance and agitation   Physical Exam: Blood pressure 125/87, pulse 115, temperature 98.1 F  (36.7 C), temperature source Oral, resp. rate 19, SpO2 96.00%. Gen: alert, awake , oriented x2, no distress HEENT: PERRLA, EOMI CVS: S1S@/RRR, no m/r/g Lungs: CTAB Abd: soft, NT, BS present, no organomegaly Ext: no edema c/c Neuro: cranial  2-12 intact Motor: 5/5 Sensory: light touch intact DTR 2+ knee and brachial Plantars: upgoing  Tremors noted in both upper ext   Labs on Admission:  Results for orders placed during the hospital encounter of 03/31/12 (from the past 48 hour(s))  GLUCOSE, CAPILLARY     Status: Abnormal   Collection Time   03/31/12  9:55 AM      Component Value Range Comment   Glucose-Capillary 171 (*) 70 - 99 mg/dL   COMPREHENSIVE METABOLIC PANEL     Status: Abnormal   Collection Time   03/31/12 10:00 AM      Component Value Range Comment   Sodium 136  135 - 145 mEq/L    Potassium 4.0  3.5 - 5.1 mEq/L    Chloride 99  96 - 112 mEq/L    CO2 28  19 - 32 mEq/L    Glucose, Bld 169 (*) 70 - 99 mg/dL    BUN 23  6 - 23 mg/dL    Creatinine, Ser 3.08  0.50 - 1.35 mg/dL    Calcium 9.9  8.4 - 65.7 mg/dL    Total Protein 7.5  6.0 - 8.3 g/dL    Albumin 3.9  3.5 - 5.2 g/dL    AST 15  0 - 37 U/L    ALT 13  0 - 53 U/L    Alkaline Phosphatase 45  39 - 117 U/L    Total Bilirubin 0.4  0.3 - 1.2 mg/dL    GFR calc non Af Amer >90  >90 mL/min    GFR calc Af Amer >90  >90 mL/min   ETHANOL     Status: Normal   Collection Time   03/31/12 10:00 AM      Component Value Range Comment   Alcohol, Ethyl (B) <11  0 - 11 mg/dL   VALPROIC ACID LEVEL     Status: Abnormal   Collection Time   03/31/12 10:00 AM      Component Value Range Comment   Valproic Acid Lvl 14.9 (*) 50.0 - 100.0 ug/mL   CBC WITH DIFFERENTIAL     Status: Abnormal   Collection Time   03/31/12 10:00 AM      Component Value Range Comment   WBC 7.5  4.0 - 10.5 K/uL    RBC 4.71  4.22 - 5.81 MIL/uL    Hemoglobin 12.5 (*) 13.0 - 17.0 g/dL    HCT 84.6 (*) 96.2 - 52.0 %    MCV 81.7  78.0 - 100.0 fL    MCH 26.5  26.0  - 34.0 pg    MCHC 32.5  30.0 - 36.0 g/dL    RDW 95.2 (*) 84.1 - 15.5 %    Platelets 231  150 - 400 K/uL    Neutrophils Relative 58  43 - 77 %    Neutro Abs 4.3  1.7 - 7.7 K/uL    Lymphocytes Relative 30  12 - 46 %    Lymphs Abs 2.3  0.7 - 4.0 K/uL    Monocytes Relative 10  3 - 12 %    Monocytes Absolute 0.8  0.1 - 1.0 K/uL    Eosinophils Relative 1  0 - 5 %    Eosinophils Absolute 0.1  0.0 - 0.7 K/uL    Basophils Relative 1  0 - 1 %    Basophils Absolute 0.1  0.0 - 0.1 K/uL   GLUCOSE, CAPILLARY     Status: Abnormal   Collection Time   03/31/12 11:52 AM      Component Value Range Comment   Glucose-Capillary 107 (*) 70 - 99 mg/dL   URINALYSIS, ROUTINE W REFLEX MICROSCOPIC     Status: Normal   Collection Time   03/31/12 12:06 PM      Component Value Range Comment   Color, Urine YELLOW  YELLOW    APPearance CLEAR  CLEAR    Specific Gravity, Urine 1.020  1.005 - 1.030    pH 6.5  5.0 - 8.0    Glucose, UA NEGATIVE  NEGATIVE mg/dL    Hgb urine dipstick NEGATIVE  NEGATIVE    Bilirubin Urine NEGATIVE  NEGATIVE    Ketones, ur NEGATIVE  NEGATIVE mg/dL    Protein, ur NEGATIVE  NEGATIVE mg/dL    Urobilinogen, UA 1.0  0.0 - 1.0 mg/dL    Nitrite NEGATIVE  NEGATIVE    Leukocytes, UA NEGATIVE  NEGATIVE MICROSCOPIC NOT DONE ON URINES WITH NEGATIVE PROTEIN, BLOOD, LEUKOCYTES, NITRITE, OR GLUCOSE <1000 mg/dL.  URINE RAPID DRUG SCREEN (HOSP PERFORMED)     Status: Abnormal   Collection Time   03/31/12 12:08 PM      Component Value Range Comment   Opiates NONE DETECTED  NONE DETECTED    Cocaine NONE DETECTED  NONE DETECTED    Benzodiazepines NONE DETECTED  NONE DETECTED    Amphetamines NONE DETECTED  NONE DETECTED    Tetrahydrocannabinol NONE DETECTED  NONE DETECTED    Barbiturates POSITIVE (*) NONE DETECTED     Radiological Exams on Admission: Ct Head Wo Contrast  03/30/2012  *RADIOLOGY REPORT*  Clinical Data: Shaking, concern for hypoglycemia  CT HEAD WITHOUT CONTRAST  Technique:  Contiguous  axial images were obtained from the base of the skull through the vertex without contrast.  Comparison: 12/03/2008  Findings:  Examination is minimally degraded secondary to patient motion artifact necessitating acquisition of additional images through the base of the skull.  Grossly unchanged scattered mild periventricular hypodensity suggesting microvascular ischemic disease.  Wallace Cullens white differentiation is otherwise well maintained without CT evidence of acute large territory infarct.  No intraparenchymal or extra-axial mass or hemorrhage.  Normal size and configuration of the ventricles and basilar cisterns.  No midline shift.  Limited visualization of the paranasal sinuses and mastoid air cells is normal.  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   Original Report Authenticated By: Tacey Ruiz, MD     Assessment/Plan  1. Seizure Like Activity Unusually  without significant post ictal period after approx seizure Appreciate Neuro consult Check CK MRI, EEG Seizure precations  2. DM: hold oral hypoglycemics, SSI for now  3. HTN: continue lisinopril  4. DVt proph: lovenox  Code status: DNR, discussed with sister/guardian Family contact:sister/guardian Merrily Brittle Disposition: Inpatient    Time Spent on Admission:  [facility] Pager: [phone] 03/31/2012, 2:42 PM

## 2012-03-31 NOTE — ED Notes (Signed)
Report called to [employer]. Patient and sister prepared for transfer. Patient alert.

## 2012-03-31 NOTE — ED Notes (Signed)
Patient is attempting to void.  Urinal provided.

## 2012-03-31 NOTE — ED Notes (Signed)
Patient continues to state that he is unable to void.  Nurse(melody) gave permission to give patient something to drink.  Diet coke provided.  Also gave coffee to family member.

## 2012-03-31 NOTE — ED Provider Notes (Signed)
History     CSN: [id number]  Arrival date & time 03/31/12  7829   First MD Initiated Contact with Patient 03/31/12 [phone]      Chief Complaint  Patient presents with  . Seizures    (Consider location/radiation/quality/duration/timing/severity/associated sxs/prior treatment) Patient is a 61 y.o. male presenting with seizures. The history is provided by the patient.  Seizures  This is a recurrent problem. The current episode started yesterday. There were 6 to 10 seizures. The most recent episode lasted 30 to 120 seconds (never cleared between the spell, total time 20 minutes). Pertinent negatives include no vomiting. Characteristics include rhythmic jerking. Characteristics do not include eye deviation, bowel incontinence, bladder incontinence or loss of consciousness. tongue sticking out, not responding, head tilted, would not speak during the spell The seizure(s) had upper extremity and lower extremity focality.  Pt had similar events yesterday and was seen at [facility] ED.  Complained of headache afterwards.  Pt was treated and released.  He does take depakote for agitation but not for seizures.  No past medical history on file.  No past surgical history on file.  History reviewed. No pertinent family history.  History  Substance Use Topics  . Smoking status: Not on file  . Smokeless tobacco: Not on file  . Alcohol Use: Not on file      Review of Systems  Constitutional: Negative for fever.  Gastrointestinal: Negative for vomiting, abdominal pain, blood in stool and bowel incontinence.  Genitourinary: Negative for bladder incontinence.  Neurological: Positive for seizures. Negative for loss of consciousness.  All other systems reviewed and are negative.    Allergies  Review of patient's allergies indicates no known allergies.  Home Medications   Current Outpatient Rx  Name  Route  Sig  Dispense  Refill  . DIVALPROEX SODIUM 125 MG PO TBEC   Oral   Take 125 mg by mouth 2  (two) times daily.         . FENOFIBRATE 145 MG PO TABS   Oral   Take 145 mg by mouth daily.         . OMEGA-3 FATTY ACIDS 1000 MG PO CAPS   Oral   Take 1 g by mouth daily.         Marland Kitchen GLIPIZIDE ER 5 MG PO TB24   Oral   Take 5 mg by mouth daily.         Marland Kitchen KETOCONAZOLE 2 % EX SHAM   Topical   Apply 1 application topically 2 (two) times a week.         Marland Kitchen LISINOPRIL 2.5 MG PO TABS   Oral   Take 2.5 mg by mouth daily.         Marland Kitchen PAROXETINE HCL 20 MG PO TABS   Oral   Take 20 mg by mouth daily.         Marland Kitchen PIOGLITAZONE HCL 30 MG PO TABS   Oral   Take 30 mg by mouth daily.         Marland Kitchen PRIMIDONE 250 MG PO TABS   Oral   Take 250 mg by mouth at bedtime.         Marland Kitchen SALICYLIC ACID 3 % EX SHAM   Topical   Apply 1 application topically 2 (two) times a week. Tues and Thurs only         . SITAGLIPTIN-METFORMIN HCL 50-1000 MG PO TABS   Oral   Take 1 tablet by mouth 2 (two) times  daily with a meal.           SpO2 98%  Physical Exam  Nursing note and vitals reviewed. Constitutional: He appears well-developed and well-nourished. No distress.  HENT:  Head: Normocephalic and atraumatic.  Right Ear: External ear normal.  Left Ear: External ear normal.  Eyes: Conjunctivae normal are normal. Right eye exhibits no discharge. Left eye exhibits no discharge. No scleral icterus.  Neck: Neck supple. No tracheal deviation present.  Cardiovascular: Normal rate, regular rhythm and intact distal pulses.   Pulmonary/Chest: Effort normal and breath sounds normal. No stridor. No respiratory distress. He has no wheezes. He has no rales.  Abdominal: Soft. Bowel sounds are normal. He exhibits no distension. There is no tenderness. There is no rebound and no guarding.  Musculoskeletal: He exhibits no edema and no tenderness.  Neurological: He is alert. He has normal strength. No sensory deficit. Cranial nerve deficit:  no gross defecits noted. He exhibits normal muscle tone. He  displays no seizure activity. Coordination normal.  Skin: Skin is warm and dry. No rash noted.  Psychiatric: He has a normal mood and affect.    ED Course  Procedures (including critical care time)  Rate: 98  Rhythm: normal sinus rhythm  QRS Axis: normal  Intervals: normal  ST/T Wave abnormalities: normal  Conduction Disutrbances:none  Narrative Interpretation:   Old EKG Reviewed: none available  Labs Reviewed  COMPREHENSIVE METABOLIC PANEL - Abnormal; Notable for the following:    Glucose, Bld 169 (*)     All other components within normal limits  VALPROIC ACID LEVEL - Abnormal; Notable for the following:    Valproic Acid Lvl 14.9 (*)     All other components within normal limits  CBC WITH DIFFERENTIAL - Abnormal; Notable for the following:    Hemoglobin 12.5 (*)     HCT 38.5 (*)     RDW 16.0 (*)     All other components within normal limits  GLUCOSE, CAPILLARY - Abnormal; Notable for the following:    Glucose-Capillary 171 (*)     All other components within normal limits  GLUCOSE, CAPILLARY - Abnormal; Notable for the following:    Glucose-Capillary 107 (*)     All other components within normal limits  ETHANOL  URINALYSIS, ROUTINE W REFLEX MICROSCOPIC  URINE RAPID DRUG SCREEN (HOSP PERFORMED)   Ct Head Wo Contrast  03/30/2012  *RADIOLOGY REPORT*  Clinical Data: Shaking, concern for hypoglycemia  CT HEAD WITHOUT CONTRAST  Technique:  Contiguous axial images were obtained from the base of the skull through the vertex without contrast.  Comparison: 12/03/2008  Findings:  Examination is minimally degraded secondary to patient motion artifact necessitating acquisition of additional images through the base of the skull.  Grossly unchanged scattered mild periventricular hypodensity suggesting microvascular ischemic disease.  Wallace Cullens white differentiation is otherwise well maintained without CT evidence of acute large territory infarct.  No intraparenchymal or extra-axial mass or  hemorrhage.  Normal size and configuration of the ventricles and basilar cisterns.  No midline shift.  Limited visualization of the paranasal sinuses and mastoid air cells is normal.  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   Original Report Authenticated By: Tacey Ruiz, MD      MDM  Pt was evaluated by Dr Thad Ranger.  Some features atypical for a prolonged seizure.  She plans on an EEG.   Would like patient admitted for observation overnight.  Celene Kras, MD 03/31/12 1254

## 2012-03-31 NOTE — ED Notes (Signed)
Called floor for transfer report. Informed patient and sister of room assignment.

## 2012-04-01 ENCOUNTER — Inpatient Hospital Stay (HOSPITAL_COMMUNITY): Payer: Medicare Other

## 2012-04-01 LAB — CBC
MCV: 82.1 fL (ref 78.0–100.0)
Platelets: 255 10*3/uL (ref 150–400)
RDW: 16.1 % — ABNORMAL HIGH (ref 11.5–15.5)
WBC: 6.8 10*3/uL (ref 4.0–10.5)

## 2012-04-01 LAB — HEPATIC FUNCTION PANEL
ALT: 14 U/L (ref 0–53)
AST: 15 U/L (ref 0–37)
Alkaline Phosphatase: 46 U/L (ref 39–117)
Bilirubin, Direct: 0.1 mg/dL (ref 0.0–0.3)
Total Bilirubin: 0.5 mg/dL (ref 0.3–1.2)

## 2012-04-01 LAB — GLUCOSE, CAPILLARY: Glucose-Capillary: 175 mg/dL — ABNORMAL HIGH (ref 70–99)

## 2012-04-01 LAB — BASIC METABOLIC PANEL
Chloride: 106 mEq/L (ref 96–112)
Creatinine, Ser: 0.63 mg/dL (ref 0.50–1.35)
GFR calc Af Amer: >90 mL/min (ref >90)

## 2012-04-01 MED ORDER — LORAZEPAM 2 MG/ML IJ SOLN
1.0000 mg | Freq: Once | INTRAMUSCULAR | Status: AC
  Administered 2012-04-01: 1 mg via INTRAVENOUS
  Filled 2012-04-01: qty 1

## 2012-04-01 MED ORDER — DIVALPROEX SODIUM 250 MG PO DR TAB
250.0000 mg | DELAYED_RELEASE_TABLET | Freq: Two times a day (BID) | ORAL | Status: DC
  Administered 2012-04-01 – 2012-04-05 (×9): 250 mg via ORAL
  Filled 2012-04-01 (×10): qty 1

## 2012-04-01 NOTE — Progress Notes (Signed)
MRI notified RN, re: they were not able to complete the MRI even after 1 mg Ativan IV 20 minutes prior to study due to pt anxiety and refusal to allow MRI. MRI techs suggested if MD wanted to re-order scan, that the scan be done under conscious sedation. Pts sister, who was present with the pt during the attempt, agreed this would be the better option for him. MD on call notified. MD agreed to cancel MRI attempt for this evening and have attending MD re-address this in the morning.

## 2012-04-01 NOTE — Progress Notes (Signed)
Subjective: Patient improved this morning.  Seems at baseline.  From further conversation with the sister it seems that the patient has been having more anger outbursts recently.  Broke his glasses with one earlier in the week.  May very well have been having one that led to his current presentation.  Lab work is not what would be expected from someone that had a 20 minute GTC seizure.  CK normal.  WBC count normal.    Objective: Current vital signs: BP 114/67  Pulse 82  Temp 98.4 F (36.9 C) (Oral)  Resp 19  Ht 6\' 1"  (1.854 m)  Wt 74.844 kg (165 lb)  BMI 21.77 kg/m2  SpO2 98% Vital signs in last 24 hours: Temp:  [97.7 F (36.5 C)-98.7 F (37.1 C)] 98.4 F (36.9 C) (11/10 0551) Pulse Rate:  [82-120] 82  (11/10 0551) Resp:  [15-19] 19  (11/10 0551) BP: (82-135)/(54-93) 114/67 mmHg (11/10 0551) SpO2:  [96 %-98 %] 98 % (11/10 0551) Weight:  [74.844 kg (165 lb)] 74.844 kg (165 lb) (11/09 1752)  Intake/Output from previous day: 11/09 0701 - 11/10 0700 In: 560 [P.O.:560] Out: 550 [Urine:550] Intake/Output this shift:   Nutritional status: Carb Control  Neurologic Exam: Mental Status:  Alert. Fluent. Dysarthric. Follows simple commands.  Cranial Nerves:  II: Discs flat bilaterally; Visual fields grossly normal, pupils equal, round, reactive to light and accommodation  III,IV, VI: ptosis not present, extra-ocular motions intact bilaterally  V,VII: smile symmetric, facial light touch sensation normal bilaterally  VIII: hearing normal bilaterally  IX,X: gag reflex present  XI: bilateral shoulder shrug  XII: midline tongue extension  Motor:  Right : Upper extremity 5/5         Left: Upper extremity 5/5   Lower extremity 5/5       Lower extremity 5/5  Tone and bulk:atrophy noted in the hand intrinsics bilaterally and in the distal lower extremities. Arches high. Mild bilateral upper extremity tremor. Able  To drink from a cup easily. Sensory: Responds to stimuli throughout  Deep  Tendon Reflexes: 3+ and symmetric with absent AJ's bilaterally  Plantars:  Right: upgoing     Left: upgoing  Cerebellar:  normal finger-to-nose and normal heel-to-shin test  CV: pulses palpable throughout    Lab Results: Basic Metabolic Panel:  Lab 04/01/12 6962 03/31/12 1646 03/31/12 1000 03/30/12 1340  NA 142 -- 136 135  K 4.4 -- 4.0 4.7  CL 106 -- 99 99  CO2 27 -- 28 23  GLUCOSE 165* -- 169* 183*  BUN 20 -- 23 25*  CREATININE 0.63 0.59 0.69 0.47*  CALCIUM 9.5 -- 9.9 9.9  MG -- 2.1 -- --  PHOS -- 3.8 -- --    Liver Function Tests:  Lab 03/31/12 1000  AST 15  ALT 13  ALKPHOS 45  BILITOT 0.4  PROT 7.5  ALBUMIN 3.9   No results found for this basename: LIPASE:5,AMYLASE:5 in the last 168 hours No results found for this basename: AMMONIA:3 in the last 168 hours  CBC:  Lab 04/01/12 0535 03/31/12 1646 03/31/12 1000 03/30/12 1340  WBC 6.8 8.9 7.5 8.7  NEUTROABS -- -- 4.3 4.7  HGB 12.6* 12.7* 12.5* 12.8*  HCT 40.0 39.4 38.5* 39.8  MCV 82.1 81.9 81.7 82.1  PLT 255 268 231 269    Cardiac Enzymes:  Lab 03/31/12 1646 03/31/12 1508  CKTOTAL 72 74  CKMB -- --  CKMBINDEX -- --  TROPONINI -- --    Lipid Panel: No results found  for this basename: CHOL:5,TRIG:5,HDL:5,CHOLHDL:5,VLDL:5,LDLCALC:5 in the last 168 hours  CBG:  Lab 04/01/12 0654 03/31/12 2146 03/31/12 1632 03/31/12 1152 03/31/12 0955  GLUCAP 165* 152* 143* 107* 171*    Microbiology: No results found for this or any previous visit.  Coagulation Studies: No results found for this basename: LABPROT:5,INR:5 in the last 72 hours  Imaging: Ct Head Wo Contrast  03/30/2012  *RADIOLOGY REPORT*  Clinical Data: Shaking, concern for hypoglycemia  CT HEAD WITHOUT CONTRAST  Technique:  Contiguous axial images were obtained from the base of the skull through the vertex without contrast.  Comparison: 12/03/2008  Findings:  Examination is minimally degraded secondary to patient motion artifact necessitating  acquisition of additional images through the base of the skull.  Grossly unchanged scattered mild periventricular hypodensity suggesting microvascular ischemic disease.  Wallace Cullens white differentiation is otherwise well maintained without CT evidence of acute large territory infarct.  No intraparenchymal or extra-axial mass or hemorrhage.  Normal size and configuration of the ventricles and basilar cisterns.  No midline shift.  Limited visualization of the paranasal sinuses and mastoid air cells is normal.  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   Original Report Authenticated By: Tacey Ruiz, MD     Medications:  I have reviewed the patient's current medications. Scheduled:   . divalproex  250 mg Oral BID  . enoxaparin (LOVENOX) injection  40 mg Subcutaneous Q24H  . fenofibrate  160 mg Oral Daily  . insulin aspart  0-9 Units Subcutaneous TID WC  . lisinopril  2.5 mg Oral Daily  . [COMPLETED] LORazepam  1 mg Intravenous Once  . PARoxetine  20 mg Oral Daily  . pioglitazone  30 mg Oral Q breakfast  . primidone  250 mg Oral QHS  . sodium chloride  3 mL Intravenous Q12H  . [DISCONTINUED] divalproex  125 mg Oral BID    Assessment/Plan:  Patient Active Hospital Problem List:  Abnormal involuntary movements (03/31/2012)   Assessment: Doubt seizure at this time.  Episode may very well have been behavioral.  Patient on Depakote for this and this has been helpful for quite a few years.  Level 14.  Tremor markedly improved.  B12, TSH, Mg, Phos are normal.     Plan:  1.  Increase Depakote to 250mg  BID  2.  LFT's, ammonia to be checked.    3.  EEG pending but may be performed as an outpatient.    4.  MRI pending    LOS: 1 day   Thana Farr, MD [facility] [phone] 04/01/2012  9:12 AM

## 2012-04-01 NOTE — Progress Notes (Signed)
Assessment/Plan: Active Problems:  Mental retardation  Abnormal involuntary movements - no recurrence. We discussed this and considered d/c. However, that would require trip back to do EEG and he already has had two visits to facilities due to recurrence. I think it would be wiser to watch here and plan d/c after EEG tomorrow if there are no other spells.   Dizziness  Diabetes mellitus   Subjective: No new events overnight. No shaking spells. Cognition at baseline.   Objective:  Vital Signs: Filed Vitals:   03/31/12 2155 04/01/12 0236 04/01/12 0315 04/01/12 0551  BP: 118/68 82/57 124/67 114/67  Pulse: 97 84 82 82  Temp: 98.7 F (37.1 C) 97.7 F (36.5 C)  98.4 F (36.9 C)  TempSrc: Oral Oral  Oral  Resp: 18 18  19   Height:      Weight:      SpO2: 97% 98%  98%     EXAM: Alert. Follows commands. Seen with sister and she thinks he is at baseline.    Intake/Output Summary (Last 24 hours) at 04/01/12 0845 Last data filed at 04/01/12 0500  Gross per 24 hour  Intake    560 ml  Output    550 ml  Net     10 ml    Lab Results:  Basename 04/01/12 0535 03/31/12 1646 03/31/12 1000  NA 142 -- 136  K 4.4 -- 4.0  CL 106 -- 99  CO2 27 -- 28  GLUCOSE 165* -- 169*  BUN 20 -- 23  CREATININE 0.63 0.59 --  CALCIUM 9.5 -- 9.9  MG -- 2.1 --  PHOS -- 3.8 --    Basename 03/31/12 1000  AST 15  ALT 13  ALKPHOS 45  BILITOT 0.4  PROT 7.5  ALBUMIN 3.9   No results found for this basename: LIPASE:2,AMYLASE:2 in the last 72 hours  Basename 04/01/12 0535 03/31/12 1646 03/31/12 1000 03/30/12 1340  WBC 6.8 8.9 -- --  NEUTROABS -- -- 4.3 4.7  HGB 12.6* 12.7* -- --  HCT 40.0 39.4 -- --  MCV 82.1 81.9 -- --  PLT 255 268 -- --    Basename 03/31/12 1646 03/31/12 1508  CKTOTAL 72 74  CKMB -- --  CKMBINDEX -- --  TROPONINI -- --   No components found with this basename: POCBNP:3 No results found for this basename: DDIMER:2 in the last 72 hours No results found for this basename:  HGBA1C:2 in the last 72 hours No results found for this basename: CHOL:2,HDL:2,LDLCALC:2,TRIG:2,CHOLHDL:2,LDLDIRECT:2 in the last 72 hours  Basename 03/31/12 1646  TSH 1.874  T4TOTAL --  T3FREE --  THYROIDAB --    Basename 03/31/12 1646  VITAMINB12 650  FOLATE --  FERRITIN --  TIBC --  IRON --  RETICCTPCT --    Studies/Results: Ct Head Wo Contrast  03/30/2012  *RADIOLOGY REPORT*  Clinical Data: Shaking, concern for hypoglycemia  CT HEAD WITHOUT CONTRAST  Technique:  Contiguous axial images were obtained from the base of the skull through the vertex without contrast.  Comparison: 12/03/2008  Findings:  Examination is minimally degraded secondary to patient motion artifact necessitating acquisition of additional images through the base of the skull.  Grossly unchanged scattered mild periventricular hypodensity suggesting microvascular ischemic disease.  Wallace Cullens white differentiation is otherwise well maintained without CT evidence of acute large territory infarct.  No intraparenchymal or extra-axial mass or hemorrhage.  Normal size and configuration of the ventricles and basilar cisterns.  No midline shift.  Limited visualization of the paranasal  sinuses and mastoid air cells is normal.  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   Original Report Authenticated By: Tacey Ruiz, MD    Medications: Medications administered in the last 24 hours reviewed.  Current Medication List reviewed.    LOS: 1 day   [facility] @ [facility] [phone]) 04/01/2012, 8:45 AM

## 2012-04-01 NOTE — Progress Notes (Signed)
Utilization review completed.  

## 2012-04-02 ENCOUNTER — Inpatient Hospital Stay (HOSPITAL_COMMUNITY): Payer: Medicare Other

## 2012-04-02 LAB — GLUCOSE, CAPILLARY: Glucose-Capillary: 149 mg/dL — ABNORMAL HIGH (ref 70–99)

## 2012-04-02 NOTE — Progress Notes (Signed)
04/02/12 Pt refusing MRI. Pt states he "will not go into the machine." One previous attempt with Ativan on board was unsuccessful. Neuro MD notified and stated that MRI can be done as an out pt in an open MRI machine. Will convey this to radiology.

## 2012-04-02 NOTE — Progress Notes (Signed)
Subjective: Patient has had some mild headache and does still complain of some dizziness at times.  Tolerating increase in Depakote.  Objective: Current vital signs: BP 126/80  Pulse 105  Temp 97.9 F (36.6 C) (Oral)  Resp 18  Ht 6\' 1"  (1.854 m)  Wt 74.844 kg (165 lb)  BMI 21.77 kg/m2  SpO2 100% Vital signs in last 24 hours: Temp:  [97.4 F (36.3 C)-98.6 F (37 C)] 97.9 F (36.6 C) (11/11 1045) Pulse Rate:  [98-105] 105  (11/11 1045) Resp:  [18-20] 18  (11/11 1045) BP: (115-128)/(68-80) 126/80 mmHg (11/11 1045) SpO2:  [97 %-100 %] 100 % (11/11 1045)  Intake/Output from previous day: 11/10 0701 - 11/11 0700 In: 120 [P.O.:120] Out: -  Intake/Output this shift:   Nutritional status: Carb Control  Neurologic Exam: Mental Status:  Alert. Fluent. Dysarthric. Follows simple commands.  Cranial Nerves:  II: Discs flat bilaterally; Visual fields grossly normal, pupils equal, round, reactive to light and accommodation  III,IV, VI: ptosis not present, extra-ocular motions intact bilaterally  V,VII: smile symmetric, facial light touch sensation normal bilaterally  VIII: hearing normal bilaterally  IX,X: gag reflex present  XI: bilateral shoulder shrug  XII: midline tongue extension  Motor:  Right : Upper extremity 5/5 Left: Upper extremity 5/5  Lower extremity 5/5 Lower extremity 5/5  Tone and bulk:atrophy noted in the hand intrinsics bilaterally and in the distal lower extremities. Arches high. Mild bilateral upper extremity tremor. Able To drink from a cup easily.  Sensory: Responds to stimuli throughout  Deep Tendon Reflexes: 3+ and symmetric with absent AJ's bilaterally  Plantars:  Right: upgoing Left: upgoing  Cerebellar:  normal finger-to-nose and normal heel-to-shin test  CV: pulses palpable throughout    Lab Results: Basic Metabolic Panel:  Lab 04/01/12 2130 03/31/12 1646 03/31/12 1000 03/30/12 1340  NA 142 -- 136 135  K 4.4 -- 4.0 4.7  CL 106 -- 99 99  CO2  27 -- 28 23  GLUCOSE 165* -- 169* 183*  BUN 20 -- 23 25*  CREATININE 0.63 0.59 0.69 0.47*  CALCIUM 9.5 -- 9.9 9.9  MG -- 2.1 -- --  PHOS -- 3.8 -- --    Liver Function Tests:  Lab 04/01/12 0838 03/31/12 1000  AST 15 15  ALT 14 13  ALKPHOS 46 45  BILITOT 0.5 0.4  PROT 7.8 7.5  ALBUMIN 3.8 3.9   No results found for this basename: LIPASE:5,AMYLASE:5 in the last 168 hours  Lab 04/01/12 0838  AMMONIA 41    CBC:  Lab 04/01/12 0535 03/31/12 1646 03/31/12 1000 03/30/12 1340  WBC 6.8 8.9 7.5 8.7  NEUTROABS -- -- 4.3 4.7  HGB 12.6* 12.7* 12.5* 12.8*  HCT 40.0 39.4 38.5* 39.8  MCV 82.1 81.9 81.7 82.1  PLT 255 268 231 269    Cardiac Enzymes:  Lab 03/31/12 1646 03/31/12 1508  CKTOTAL 72 74  CKMB -- --  CKMBINDEX -- --  TROPONINI -- --    Lipid Panel: No results found for this basename: CHOL:5,TRIG:5,HDL:5,CHOLHDL:5,VLDL:5,LDLCALC:5 in the last 168 hours  CBG:  Lab 04/02/12 1152 04/02/12 0729 04/01/12 2123 04/01/12 1738 04/01/12 1151  GLUCAP 149* 184* 185* 199* 175*    Microbiology: No results found for this or any previous visit.  Coagulation Studies: No results found for this basename: LABPROT:5,INR:5 in the last 72 hours  Imaging: No results found.  Medications:  I have reviewed the patient's current medications. Scheduled:   . divalproex  250 mg Oral BID  .  enoxaparin (LOVENOX) injection  40 mg Subcutaneous Q24H  . fenofibrate  160 mg Oral Daily  . insulin aspart  0-9 Units Subcutaneous TID WC  . lisinopril  2.5 mg Oral Daily  . [COMPLETED] LORazepam  1 mg Intravenous Once  . PARoxetine  20 mg Oral Daily  . pioglitazone  30 mg Oral Q breakfast  . primidone  250 mg Oral QHS  . sodium chloride  3 mL Intravenous Q12H    Assessment/Plan:  Patient Active Hospital Problem List:  Abnormal involuntary movements (03/31/2012)   Assessment: No recurrence.  Likely behavioral.     Plan:  1.  EEG pending.  May be performed as an outpatientizziness  (03/31/2012)  2.  MRI pending and may be performed as an outpatient as well.   3.  Continue Depakote at current dose.   Dizziness   Assessment: Unclear etiology.   Plan: Orthostatic BP and HR    LOS: 2 days   Thana Farr, MD [facility] [phone] 04/02/2012  12:43 PM

## 2012-04-02 NOTE — Evaluation (Signed)
Physical Therapy Evaluation Patient Details Name: Todd Padilla MRN: [id number] DOB: Nov 24, 1950 Today's Date: 04/02/2012 Time: 4098-1191 PT Time Calculation (min): 17 min  PT Assessment / Plan / Recommendation Clinical Impression  Pt presented to ED with seizure like activity and now requires assist for all ADLs and mobility. Pt requires 24/7 supervision for safe mobility due to increased fall risk and cognitive deficits from MR. Pt's sister reports she can not provide 24/7 supervision. Pt to benefit from SNF upon d/c to maximize functional recovery to achieve safe I function prior to transitioning home. Sister requested [facility] due to their father and uncle there. Dr. Kevan Ny notified and agreed with assessment.    PT Assessment  Patient needs continued PT services    Follow Up Recommendations  SNF;Supervision/Assistance - 24 hour    Does the patient have the potential to tolerate intense rehabilitation      Barriers to Discharge Decreased caregiver support      Equipment Recommendations       Recommendations for Other Services     Frequency Min 3X/week    Precautions / Restrictions Precautions Precautions: Fall Restrictions Weight Bearing Restrictions: No   Pain: denies      Mobility  Bed Mobility Bed Mobility: Not assessed (pt received sitting up in chair) Transfers Transfers: Sit to Stand;Stand to Sit Sit to Stand: 4: Min assist;With upper extremity assist;With armrests;From chair/3-in-1 Stand to Sit: 4: Min assist;With armrests;To chair/3-in-1 Details for Transfer Assistance: v/c's for safe hand placement Ambulation/Gait Ambulation/Gait Assistance: 4: Min assist Ambulation Distance (Feet): 150 Feet Assistive device: Rolling walker Ambulation/Gait Assistance Details: attempted ambulation without RW and patient unsteady and was reaching for handrail and furniture to hold onto. pt with increased stability with RW however pt unsafe to manage walker I'ly. Pt tripped over  RW during turning and is unable to stay in walker Gait Pattern: Step-to pattern;Narrow base of support Stairs: No    Shoulder Instructions     Exercises     PT Diagnosis: Difficulty walking  PT Problem List: Decreased strength;Decreased mobility PT Treatment Interventions: Gait training;Functional mobility training   PT Goals Acute Rehab PT Goals PT Goal Formulation: With family Time For Goal Achievement: 04/16/12 Potential to Achieve Goals: Good Pt will go Supine/Side to Sit: with modified independence;with HOB 0 degrees PT Goal: Supine/Side to Sit - Progress: Goal set today Pt will go Sit to Stand: with modified independence PT Goal: Sit to Stand - Progress: Goal set today Pt will Transfer Bed to Chair/Chair to Bed: with modified independence PT Transfer Goal: Bed to Chair/Chair to Bed - Progress: Goal set today Pt will Ambulate: >150 feet;with modified independence;with least restrictive assistive device PT Goal: Ambulate - Progress: Goal set today  Visit Information  Last PT Received On: 04/02/12 Assistance Needed: +1    Subjective Data  Subjective: Pt received sitting up in chair. sister present Patient Stated Goal: Pt desires to go home.   Prior Functioning  Home Living Lives With: Family (sister) Available Help at Discharge: Available PRN/intermittently;Family Type of Home: House Home Access: Ramped entrance Home Layout: One level Bathroom Shower/Tub: Engineer, manufacturing systems: Standard Home Adaptive Equipment: Straight cane Prior Function Level of Independence: Independent (until recently with seizure like symptoms) Able to Take Stairs?: Yes Driving: No Communication Communication:  (mentally retarded) Dominant Hand: Right    Cognition  Overall Cognitive Status: History of cognitive impairments - at baseline Arousal/Alertness: Awake/alert Orientation Level: Appears intact for tasks assessed (at baseline) Behavior During Session: [facility] for tasks  performed (at baseline)    Extremity/Trunk Assessment Right Upper Extremity Assessment RUE ROM/Strength/Tone: Within functional levels Left Upper Extremity Assessment LUE ROM/Strength/Tone: Within functional levels Right Lower Extremity Assessment RLE ROM/Strength/Tone: Within functional levels Left Lower Extremity Assessment LLE ROM/Strength/Tone: Within functional levels Trunk Assessment Trunk Assessment: Normal   Balance Balance Balance Assessed: Yes Static Standing Balance Static Standing - Balance Support: Right upper extremity supported Static Standing - Level of Assistance: 4: Min assist Static Standing - Comment/# of Minutes: pt with posterior lean and swaying left/right  End of Session PT - End of Session Equipment Utilized During Treatment: Gait belt Activity Tolerance: Patient tolerated treatment well Patient left: in chair;with call bell/phone within reach;with family/visitor present Nurse Communication: Mobility status  GP     Marcene Brawn 04/02/2012, 4:50 PM  Lewis Shock, PT, DPT Pager #: [phone] Office #: [phone]

## 2012-04-02 NOTE — Progress Notes (Signed)
Subjective: Patient is calm today.  Really back to baseline as I know him.  Depakote has been increased which has helped with behavior control this year.  No obvious focal abnormalities.  EEG plan for today and if normal will likely discharge without MRI unless further symptoms warrant it  Objective: Weight change:   Intake/Output Summary (Last 24 hours) at 04/02/12 0807 Last data filed at 04/01/12 1700  Gross per 24 hour  Intake    120 ml  Output      0 ml  Net    120 ml   Filed Vitals:   04/01/12 1000 04/01/12 1400 04/01/12 1809 04/01/12 2121  BP: 122/60 128/69 120/68 115/68  Pulse: 81 101 98 100  Temp: 98.3 F (36.8 C) 97.4 F (36.3 C) 97.4 F (36.3 C) 98.6 F (37 C)  TempSrc: Oral Oral Oral Oral  Resp: 18 18 18 18   Height:      Weight:      SpO2: 95% 99% 97% 98%   General Appearance: Alert, cooperative, no distress, appears stated age Head: Normocephalic, without obvious abnormality, atraumatic Neck: Supple, symmetrical Lungs: Clear to auscultation bilaterally, respirations unlabored Heart: Regular rate and rhythm, S1 and S2 normal, no murmur, rub or gallop Abdomen: Soft, non-tender, bowel sounds active all four quadrants, no masses, no organomegaly Extremities: Extremities normal, atraumatic, no cyanosis or edema Pulses: 2+ and symmetric all extremities Skin: Skin color, texture, turgor normal, no rashes or lesions Neuro: Alert and nonfocal   Lab Results:  Basename 04/01/12 0535 03/31/12 1646 03/31/12 1000  NA 142 -- 136  K 4.4 -- 4.0  CL 106 -- 99  CO2 27 -- 28  GLUCOSE 165* -- 169*  BUN 20 -- 23  CREATININE 0.63 0.59 --  CALCIUM 9.5 -- 9.9  MG -- 2.1 --  PHOS -- 3.8 --    Basename 04/01/12 0838 03/31/12 1000  AST 15 15  ALT 14 13  ALKPHOS 46 45  BILITOT 0.5 0.4  PROT 7.8 7.5  ALBUMIN 3.8 3.9   No results found for this basename: LIPASE:2,AMYLASE:2 in the last 72 hours  Basename 04/01/12 0535 03/31/12 1646 03/31/12 1000 03/30/12 1340  WBC 6.8  8.9 -- --  NEUTROABS -- -- 4.3 4.7  HGB 12.6* 12.7* -- --  HCT 40.0 39.4 -- --  MCV 82.1 81.9 -- --  PLT 255 268 -- --    Basename 03/31/12 1646 03/31/12 1508  CKTOTAL 72 74  CKMB -- --  CKMBINDEX -- --  TROPONINI -- --   No components found with this basename: POCBNP:3 No results found for this basename: DDIMER:2 in the last 72 hours No results found for this basename: HGBA1C:2 in the last 72 hours No results found for this basename: CHOL:2,HDL:2,LDLCALC:2,TRIG:2,CHOLHDL:2,LDLDIRECT:2 in the last 72 hours  Basename 03/31/12 1646  TSH 1.874  T4TOTAL --  T3FREE --  THYROIDAB --    Basename 03/31/12 1646  VITAMINB12 650  FOLATE --  FERRITIN --  TIBC --  IRON --  RETICCTPCT --    Studies/Results: No results found. Medications: Scheduled Meds:   . divalproex  250 mg Oral BID  . enoxaparin (LOVENOX) injection  40 mg Subcutaneous Q24H  . fenofibrate  160 mg Oral Daily  . insulin aspart  0-9 Units Subcutaneous TID WC  . lisinopril  2.5 mg Oral Daily  . [COMPLETED] LORazepam  1 mg Intravenous Once  . PARoxetine  20 mg Oral Daily  . pioglitazone  30 mg Oral Q breakfast  .  primidone  250 mg Oral QHS  . sodium chloride  3 mL Intravenous Q12H   Continuous Infusions:  PRN Meds:.acetaminophen  Assessment/Plan:  Active Problems:  Mental retardation - stable Abnormal involuntary movements - no recurrence.  Plan to discharge home today after EEG if no evidence of seizure activity Dizziness - symptomatically better.  We'll have physical therapy evaluate for ambulatory safety Diabetes mellitus - blood sugar okay   LOS: 2 days   Tomeca Helm,Amine NEVILL 04/02/2012, 8:07 AM

## 2012-04-02 NOTE — Progress Notes (Signed)
04/02/12: orthostatic vital signs done at 1500, 5 minutes apart Lying 132/63  Sitting 110/51 Standing 110/60

## 2012-04-02 NOTE — Progress Notes (Signed)
EEG completed as ordered.

## 2012-04-03 ENCOUNTER — Encounter (HOSPITAL_COMMUNITY): Payer: Self-pay

## 2012-04-03 LAB — GLUCOSE, CAPILLARY
Glucose-Capillary: 188 mg/dL — ABNORMAL HIGH (ref 70–99)
Glucose-Capillary: 176 mg/dL — ABNORMAL HIGH (ref 70–99)

## 2012-04-03 MED ORDER — DIVALPROEX SODIUM 125 MG PO DR TAB: DELAYED_RELEASE_TABLET | ORAL | Status: DC

## 2012-04-03 NOTE — Progress Notes (Signed)
   CARE MANAGEMENT NOTE 04/03/2012  Patient:  Todd Padilla, Todd Padilla   Account Number:  [id number]  Date Initiated:  04/03/2012  Documentation initiated by:  [facility]  Subjective/Objective Assessment:   seizures     Action/Plan:   Anticipated DC Date:  04/03/2012   Anticipated DC Plan:  SKILLED NURSING FACILITY  In-house referral  Clinical Social Worker      DC Planning Services  CM consult      Choice offered to / List presented to:             Status of service:  Completed, signed off Medicare Important Message given?   (If response is "NO", the following Medicare IM given date fields will be blank) Date Medicare IM given:   Date Additional Medicare IM given:    Discharge Disposition:    Per UR Regulation:    If discussed at Long Length of Stay Meetings, dates discussed:    Comments:  04/03/2012 1015 NCM spoke to pt's legal guardian/sister, Merrily Brittle, # [phone]. States she wanted an idea when pt will be discharged to SNF-rehab. NCM explained CSW will discuss with her the process and what is needed to complete his d/c to SNF. Sister requested call from CSW. NCM spoke to CSW and she will follow up with sister about d/c plans. Isidoro Donning RN CCM Case M gmt phone [phone]

## 2012-04-03 NOTE — ED Provider Notes (Signed)
Medical screening examination/treatment/procedure(s) were conducted as a shared visit with non-physician practitioner(s) and myself.  I personally evaluated the patient during the encounter.  Patient with possible seizure-like activity earlier today. Suspect that patient did not have a seizure in more likely related to hypoglycemia. Unit he did have a seizure he is currently back to his baseline. His workup is fairly unremarkable. The patient is safe for discharge at this time. Caregivers are at bedside will splint. Outpatient followup.  Raeford Razor, MD 04/03/12 [phone]

## 2012-04-03 NOTE — Clinical Social Work Placement (Addendum)
    Clinical Social Work Department CLINICAL SOCIAL WORK PLACEMENT NOTE 04/03/2012  Patient:  Todd Padilla, Todd Padilla  Account Number:  [id number] Admit date:  03/31/2012  Clinical Social Worker:  Peggyann Shoals  Date/time:  04/03/2012 02:20 PM  Clinical Social Work is seeking post-discharge placement for this patient at the following level of care:   SKILLED NURSING   (*CSW will update this form in Epic as items are completed)   04/03/2012  Patient/family provided with [facility] Department of Clinical Social Work's list of facilities offering this level of care within the geographic area requested by the patient (or if unable, by the patient's family).  04/03/2012  Patient/family informed of their freedom to choose among providers that offer the needed level of care, that participate in Medicare, Medicaid or managed care program needed by the patient, have an available bed and are willing to accept the patient.  04/03/2012  Patient/family informed of [facility]' ownership interest in [facility], as well as of the fact that they are under no obligation to receive care at this facility.  PASARR submitted to EDS on 04/03/2012 PASARR number received from EDS on 04/05/2012  FL2 transmitted to all facilities in geographic area requested by pt/family on  04/03/2012 FL2 transmitted to all facilities within larger geographic area on   Patient informed that his/her managed care company has contracts with or will negotiate with  certain facilities, including the following:     Patient/family informed of bed offers received:  04/04/2012 Patient chooses bed at [facility]  Physician recommends and patient chooses bed at SNF    Patient to be transferred to [facility] on 04/05/2012 Patient to be transferred to facility by Pt's sister, Bonita Quin  The following physician request were entered in Epic:   Additional Comments: Pt will need a level 2 PASARR.

## 2012-04-03 NOTE — Clinical Social Work Psychosocial (Signed)
     Clinical Social Work Department BRIEF PSYCHOSOCIAL ASSESSMENT 04/03/2012  Patient:  Todd Padilla, Todd Padilla     Account Number:  [id number]     Admit date:  03/31/2012  Clinical Social Worker:  Peggyann Shoals  Date/Time:  04/03/2012 01:51 PM  Referred by:  Physician  Date Referred:  04/03/2012 Referred for  SNF Placement   Other Referral:   Interview type:  Family Other interview type:    PSYCHOSOCIAL DATA Living Status:  FAMILY Admitted from facility:   Level of care:   Primary support name:  Merrily Brittle Primary support relationship to patient:  SIBLING Degree of support available:   Very supportive. Sister is pt's guardian.    CURRENT CONCERNS Current Concerns  Post-Acute Placement   Other Concerns:    SOCIAL WORK ASSESSMENT / PLAN CSW met with pt's sister to address consult. CSW introduced herself and explained role of social work. CSW also explained the process of discharging to a SNF. Pt lives with sister, who is also his guardian.    Pt needs ST SNF at this discharge. Pt and pt's family is very familiar with [facility] and this would be first choice. CSW initiated SNF referral to [facility]. CSW also initiated Level 2 PASARR as pt will need review prior to SNF placement. CSW will continue to follow.   Assessment/plan status:  Psychosocial Support/Ongoing Assessment of Needs Other assessment/ plan:   Information/referral to community resources:    PATIENTS/FAMILYS RESPONSE TO PLAN OF CARE: Pt was very pleasant. Pt's sister is agreeable to discharge plan.

## 2012-04-03 NOTE — Discharge Summary (Signed)
Physician Discharge Summary  NAME:Todd Padilla  ZOX:[id number]  DOB: [birth year]   Admit date: 03/31/2012 Discharge date: 04/03/2012  Discharge Diagnoses:  Active Problems:  Mental retardation - stable  Abnormal involuntary movements - resolved  Dizziness - improved  Diabetes mellitus - aware  Agitation - improved   Discharge Condition: Improved  Hospital Course: Mr. Todd Padilla is a very pleasant 61 year old male with lifelong mental retardation, adult onset type 2 diabetes mellitus, and hypertension.  Over the past year or so he is at increased behavioral issues with impulsive responses.  Has improved a great deal on Depakote 125 milligrams by mouth twice daily.  On November 8 he was brought to the emergency room after he had some shaking movements of his arms and head that may last about a minute.  It was felt that he might have seizure activity.  He was evaluated and actually discharged home.  On the day of admission, after breakfast, he experienced jerking thoracic movement began involving both arms and legs and head arching backwards.  No eye rolling or tongue biting or bowel or bladder incontinence.  He did have a blank stare during these episodes to each episode lasts about a minute and he had 6 or 8 episodes.  Consistent with seizure activity.  No loss of consciousness after the episodes. While hospitalized he has not been able to perform an MRI secondary to behavior but noncontrast head CT on admission was without obvious acute change.  With increase of Depakote he has been calm.  Seems to be a little more dizzy than usual but is not significantly orthostatic.  Was able to work with physical therapy yesterday and plans now are to transfer to [facility] skilled nursing facility in [place] for further rehabilitation and observation and therapy prior to transfer her home.  Condition on discharge from the hospital is improved  Consults: Treatment Team:  Kym Groom,  MD  Disposition: 01-Home or Self Care  Discharge Orders    Future Orders Please Complete By Expires   Diet - low sodium heart healthy      Increase activity slowly      Discharge instructions      Comments:   Notify M.D. for agitation or increased ataxia   Call MD for:  temperature >100.4      Call MD for:  persistant nausea and vomiting      Call MD for:  difficulty breathing, headache or visual disturbances      Call MD for:  persistant dizziness or light-headedness          Medication List     As of 04/03/2012  7:51 AM    TAKE these medications         DENOREX EXTRA STRENGTH 3 % Sham   Generic drug: Salicylic Acid   Apply 1 application topically 2 (two) times a week. Tues and Thurs only      divalproex 125 MG DR tablet   Commonly known as: DEPAKOTE   125 milligrams by mouth every morning and 250 milligrams by mouth every evening after supper      fenofibrate 145 MG tablet   Commonly known as: TRICOR   Take 145 mg by mouth daily.      fish oil-omega-3 fatty acids 1000 MG capsule   Take 1 g by mouth daily.      glipiZIDE 5 MG 24 hr tablet   Commonly known as: GLUCOTROL XL   Take 5 mg by mouth daily.  ketoconazole 2 % shampoo   Commonly known as: NIZORAL   Apply 1 application topically 2 (two) times a week.      lisinopril 2.5 MG tablet   Commonly known as: PRINIVIL,ZESTRIL   Take 2.5 mg by mouth daily.      PARoxetine 20 MG tablet   Commonly known as: PAXIL   Take 20 mg by mouth daily.      pioglitazone 30 MG tablet   Commonly known as: ACTOS   Take 30 mg by mouth daily.      primidone 250 MG tablet   Commonly known as: MYSOLINE   Take 250 mg by mouth at bedtime.      sitaGLIPtan-metformin 50-1000 MG per tablet   Commonly known as: JANUMET   Take 1 tablet by mouth 2 (two) times daily with a meal.         Things to follow up in the outpatient setting: Monitor for ataxia and agitation  Time coordinating discharge: 33 minutes   The results  of significant diagnostics from this hospitalization (including imaging, microbiology, ancillary and laboratory) are listed below for reference.    Significant Diagnostic Studies: Ct Head Wo Contrast  03/30/2012  *RADIOLOGY REPORT*  Clinical Data: Shaking, concern for hypoglycemia  CT HEAD WITHOUT CONTRAST  Technique:  Contiguous axial images were obtained from the base of the skull through the vertex without contrast.  Comparison: 12/03/2008  Findings:  Examination is minimally degraded secondary to patient motion artifact necessitating acquisition of additional images through the base of the skull.  Grossly unchanged scattered mild periventricular hypodensity suggesting microvascular ischemic disease.  Wallace Cullens white differentiation is otherwise well maintained without CT evidence of acute large territory infarct.  No intraparenchymal or extra-axial mass or hemorrhage.  Normal size and configuration of the ventricles and basilar cisterns.  No midline shift.  Limited visualization of the paranasal sinuses and mastoid air cells is normal.  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   Original Report Authenticated By: Tacey Ruiz, MD     Microbiology: No results found for this or any previous visit (from the past 240 hour(s)).   Labs: Results for orders placed during the hospital encounter of 03/31/12  COMPREHENSIVE METABOLIC PANEL      Component Value Range   Sodium 136  135 - 145 mEq/L   Potassium 4.0  3.5 - 5.1 mEq/L   Chloride 99  96 - 112 mEq/L   CO2 28  19 - 32 mEq/L   Glucose, Bld 169 (*) 70 - 99 mg/dL   BUN 23  6 - 23 mg/dL   Creatinine, Ser 1.61  0.50 - 1.35 mg/dL   Calcium 9.9  8.4 - 09.6 mg/dL   Total Protein 7.5  6.0 - 8.3 g/dL   Albumin 3.9  3.5 - 5.2 g/dL   AST 15  0 - 37 U/L   ALT 13  0 - 53 U/L   Alkaline Phosphatase 45  39 - 117 U/L   Total Bilirubin 0.4  0.3 - 1.2 mg/dL   GFR calc non Af Amer >90  >90  mL/min   GFR calc Af Amer >90  >90 mL/min  URINE RAPID DRUG SCREEN (HOSP PERFORMED)      Component Value Range   Opiates NONE DETECTED  NONE DETECTED   Cocaine NONE DETECTED  NONE DETECTED   Benzodiazepines NONE DETECTED  NONE DETECTED   Amphetamines NONE DETECTED  NONE DETECTED   Tetrahydrocannabinol  NONE DETECTED  NONE DETECTED   Barbiturates POSITIVE (*) NONE DETECTED  ETHANOL      Component Value Range   Alcohol, Ethyl (B) <11  0 - 11 mg/dL  VALPROIC ACID LEVEL      Component Value Range   Valproic Acid Lvl 14.9 (*) 50.0 - 100.0 ug/mL  CBC WITH DIFFERENTIAL      Component Value Range   WBC 7.5  4.0 - 10.5 K/uL   RBC 4.71  4.22 - 5.81 MIL/uL   Hemoglobin 12.5 (*) 13.0 - 17.0 g/dL   HCT 45.4 (*) 09.8 - 11.9 %   MCV 81.7  78.0 - 100.0 fL   MCH 26.5  26.0 - 34.0 pg   MCHC 32.5  30.0 - 36.0 g/dL   RDW 14.7 (*) 82.9 - 56.2 %   Platelets 231  150 - 400 K/uL   Neutrophils Relative 58  43 - 77 %   Neutro Abs 4.3  1.7 - 7.7 K/uL   Lymphocytes Relative 30  12 - 46 %   Lymphs Abs 2.3  0.7 - 4.0 K/uL   Monocytes Relative 10  3 - 12 %   Monocytes Absolute 0.8  0.1 - 1.0 K/uL   Eosinophils Relative 1  0 - 5 %   Eosinophils Absolute 0.1  0.0 - 0.7 K/uL   Basophils Relative 1  0 - 1 %   Basophils Absolute 0.1  0.0 - 0.1 K/uL  URINALYSIS, ROUTINE W REFLEX MICROSCOPIC      Component Value Range   Color, Urine YELLOW  YELLOW   APPearance CLEAR  CLEAR   Specific Gravity, Urine 1.020  1.005 - 1.030   pH 6.5  5.0 - 8.0   Glucose, UA NEGATIVE  NEGATIVE mg/dL   Hgb urine dipstick NEGATIVE  NEGATIVE   Bilirubin Urine NEGATIVE  NEGATIVE   Ketones, ur NEGATIVE  NEGATIVE mg/dL   Protein, ur NEGATIVE  NEGATIVE mg/dL   Urobilinogen, UA 1.0  0.0 - 1.0 mg/dL   Nitrite NEGATIVE  NEGATIVE   Leukocytes, UA NEGATIVE  NEGATIVE  GLUCOSE, CAPILLARY      Component Value Range   Glucose-Capillary 171 (*) 70 - 99 mg/dL  GLUCOSE, CAPILLARY      Component Value Range   Glucose-Capillary 107 (*) 70 -  99 mg/dL  CK      Component Value Range   Total CK 74  7 - 232 U/L  SEDIMENTATION RATE      Component Value Range   Sed Rate 4  0 - 16 mm/hr  VITAMIN B12      Component Value Range   Vitamin B-12 650  211 - 911 pg/mL  TSH      Component Value Range   TSH 1.874  0.350 - 4.500 uIU/mL  MAGNESIUM      Component Value Range   Magnesium 2.1  1.5 - 2.5 mg/dL  PHOSPHORUS      Component Value Range   Phosphorus 3.8  2.3 - 4.6 mg/dL  CALCIUM, IONIZED      Component Value Range   Calcium, Ion 1.30 (*) 1.12 - 1.32 mmol/L  CBC      Component Value Range   WBC 8.9  4.0 - 10.5 K/uL   RBC 4.81  4.22 - 5.81 MIL/uL   Hemoglobin 12.7 (*) 13.0 - 17.0 g/dL   HCT 13.0  86.5 - 78.4 %   MCV 81.9  78.0 - 100.0 fL   MCH 26.4  26.0 - 34.0  pg   MCHC 32.2  30.0 - 36.0 g/dL   RDW 04.5 (*) 40.9 - 81.1 %   Platelets 268  150 - 400 K/uL  CK      Component Value Range   Total CK 72  7 - 232 U/L  GLUCOSE, CAPILLARY      Component Value Range   Glucose-Capillary 143 (*) 70 - 99 mg/dL  CBC      Component Value Range   WBC 6.8  4.0 - 10.5 K/uL   RBC 4.87  4.22 - 5.81 MIL/uL   Hemoglobin 12.6 (*) 13.0 - 17.0 g/dL   HCT 91.4  78.2 - 95.6 %   MCV 82.1  78.0 - 100.0 fL   MCH 25.9 (*) 26.0 - 34.0 pg   MCHC 31.5  30.0 - 36.0 g/dL   RDW 21.3 (*) 08.6 - 57.8 %   Platelets 255  150 - 400 K/uL  BASIC METABOLIC PANEL      Component Value Range   Sodium 142  135 - 145 mEq/L   Potassium 4.4  3.5 - 5.1 mEq/L   Chloride 106  96 - 112 mEq/L   CO2 27  19 - 32 mEq/L   Glucose, Bld 165 (*) 70 - 99 mg/dL   BUN 20  6 - 23 mg/dL   Creatinine, Ser 4.69  0.50 - 1.35 mg/dL   Calcium 9.5  8.4 - 62.9 mg/dL   GFR calc non Af Amer >90  >90 mL/min   GFR calc Af Amer >90  >90 mL/min  CREATININE, SERUM      Component Value Range   Creatinine, Ser 0.59  0.50 - 1.35 mg/dL   GFR calc non Af Amer >90  >90 mL/min   GFR calc Af Amer >90  >90 mL/min  GLUCOSE, CAPILLARY      Component Value Range   Glucose-Capillary 152 (*)  70 - 99 mg/dL  GLUCOSE, CAPILLARY      Component Value Range   Glucose-Capillary 165 (*) 70 - 99 mg/dL  HEPATIC FUNCTION PANEL      Component Value Range   Total Protein 7.8  6.0 - 8.3 g/dL   Albumin 3.8  3.5 - 5.2 g/dL   AST 15  0 - 37 U/L   ALT 14  0 - 53 U/L   Alkaline Phosphatase 46  39 - 117 U/L   Total Bilirubin 0.5  0.3 - 1.2 mg/dL   Bilirubin, Direct 0.1  0.0 - 0.3 mg/dL   Indirect Bilirubin 0.4  0.3 - 0.9 mg/dL  AMMONIA      Component Value Range   Ammonia 41  11 - 60 umol/L  GLUCOSE, CAPILLARY      Component Value Range   Glucose-Capillary 175 (*) 70 - 99 mg/dL  GLUCOSE, CAPILLARY      Component Value Range   Glucose-Capillary 199 (*) 70 - 99 mg/dL  GLUCOSE, CAPILLARY      Component Value Range   Glucose-Capillary 185 (*) 70 - 99 mg/dL  GLUCOSE, CAPILLARY      Component Value Range   Glucose-Capillary 184 (*) 70 - 99 mg/dL   Comment 1 Documented in Chart     Comment 2 Notify RN    GLUCOSE, CAPILLARY      Component Value Range   Glucose-Capillary 149 (*) 70 - 99 mg/dL  GLUCOSE, CAPILLARY      Component Value Range   Glucose-Capillary 169 (*) 70 - 99 mg/dL  GLUCOSE, CAPILLARY  Component Value Range   Glucose-Capillary 245 (*) 70 - 99 mg/dL   Comment 1 Documented in Chart     Comment 2 Notify RN    GLUCOSE, CAPILLARY      Component Value Range   Glucose-Capillary 188 (*) 70 - 99 mg/dL   Comment 1 Documented in Chart     Comment 2 Notify RN       Signed: Jannatul Wojdyla,Shepherd NEVILL 04/03/2012, 7:51 AM

## 2012-04-03 NOTE — Consult Note (Signed)
Subjective: Patient continues to tolerate the higher dose of Depakote.  Tremor has not increased.  EEG showed no epileptiform activity.    Objective: Current vital signs: BP 118/65  Pulse 99  Temp 97.9 F (36.6 C) (Oral)  Resp 20  Ht 6\' 1"  (1.854 m)  Wt 74.844 kg (165 lb)  BMI 21.77 kg/m2  SpO2 98% Vital signs in last 24 hours: Temp:  [97.2 F (36.2 C)-98.5 F (36.9 C)] 97.9 F (36.6 C) (11/12 1004) Pulse Rate:  [86-105] 99  (11/12 1004) Resp:  [18-20] 20  (11/12 1004) BP: (110-132)/(51-82) 118/65 mmHg (11/12 1004) SpO2:  [97 %-100 %] 98 % (11/12 1004)  Intake/Output from previous day: 11/11 0701 - 11/12 0700 In: 960 [P.O.:960] Out: -  Intake/Output this shift:   Nutritional status: Carb Control  Neurologic Exam: Mental Status:  Alert. Fluent. Dysarthric. Follows simple commands.  Cranial Nerves:  II: Discs flat bilaterally; Visual fields grossly normal, pupils equal, round, reactive to light and accommodation  III,IV, VI: ptosis not present, extra-ocular motions intact bilaterally  V,VII: smile symmetric, facial light touch sensation normal bilaterally  VIII: hearing normal bilaterally  IX,X: gag reflex present  XI: bilateral shoulder shrug  XII: midline tongue extension  Motor:  Right : Upper extremity 5/5           Left: Upper extremity 5/5   Lower extremity 5/5        Lower extremity 5/5  Tone and bulk:atrophy noted in the hand intrinsics bilaterally and in the distal lower extremities. Arches high. Mild bilateral upper extremity tremor. Able To drink from a cup easily.  Sensory: Responds to stimuli throughout  Deep Tendon Reflexes: 3+ and symmetric with absent AJ's bilaterally  Plantars:  Right: upgoing      Left: upgoing  Cerebellar:  normal finger-to-nose and normal heel-to-shin test  CV: pulses palpable throughout    Lab Results: Basic Metabolic Panel:  Lab 04/01/12 1610 03/31/12 1646 03/31/12 1000 03/30/12 1340  NA 142 -- 136 135  K 4.4 -- 4.0 4.7   CL 106 -- 99 99  CO2 27 -- 28 23  GLUCOSE 165* -- 169* 183*  BUN 20 -- 23 25*  CREATININE 0.63 0.59 0.69 0.47*  CALCIUM 9.5 -- 9.9 9.9  MG -- 2.1 -- --  PHOS -- 3.8 -- --    Liver Function Tests:  Lab 04/01/12 0838 03/31/12 1000  AST 15 15  ALT 14 13  ALKPHOS 46 45  BILITOT 0.5 0.4  PROT 7.8 7.5  ALBUMIN 3.8 3.9   No results found for this basename: LIPASE:5,AMYLASE:5 in the last 168 hours  Lab 04/01/12 0838  AMMONIA 41    CBC:  Lab 04/01/12 0535 03/31/12 1646 03/31/12 1000 03/30/12 1340  WBC 6.8 8.9 7.5 8.7  NEUTROABS -- -- 4.3 4.7  HGB 12.6* 12.7* 12.5* 12.8*  HCT 40.0 39.4 38.5* 39.8  MCV 82.1 81.9 81.7 82.1  PLT 255 268 231 269    Cardiac Enzymes:  Lab 03/31/12 1646 03/31/12 1508  CKTOTAL 72 74  CKMB -- --  CKMBINDEX -- --  TROPONINI -- --    Lipid Panel: No results found for this basename: CHOL:5,TRIG:5,HDL:5,CHOLHDL:5,VLDL:5,LDLCALC:5 in the last 168 hours  CBG:  Lab 04/03/12 0641 04/02/12 2151 04/02/12 1708 04/02/12 1152 04/02/12 0729  GLUCAP 188* 245* 169* 149* 184*    Microbiology: No results found for this or any previous visit.  Coagulation Studies: No results found for this basename: LABPROT:5,INR:5 in the last 72 hours  Imaging: No results found.  Medications:  I have reviewed the patient's current medications. Scheduled:   . divalproex  250 mg Oral BID  . enoxaparin (LOVENOX) injection  40 mg Subcutaneous Q24H  . fenofibrate  160 mg Oral Daily  . insulin aspart  0-9 Units Subcutaneous TID WC  . lisinopril  2.5 mg Oral Daily  . PARoxetine  20 mg Oral Daily  . pioglitazone  30 mg Oral Q breakfast  . primidone  250 mg Oral QHS  . sodium chloride  3 mL Intravenous Q12H    Assessment/Plan:  Patient Active Hospital Problem List:  Abnormal involuntary movements (03/31/2012)   Assessment: No recurrence.  Tolerating increase in Depakote.  EEG normal.  Imaging unable to be performed beyond CT scan.  Patient at baseline.      Plan:  1.  No further neurologic intervention is recommended at this time.  If further questions arise, please call or page at that time.  Thank you for allowing neurology to participate in the care of this patient.  MRI of the brain may be performed as an outpatient.     LOS: 3 days   Thana Farr, MD [facility] [phone] 04/03/2012  11:23 AM

## 2012-04-04 LAB — GLUCOSE, CAPILLARY: Glucose-Capillary: 188 mg/dL — ABNORMAL HIGH (ref 70–99)

## 2012-04-04 NOTE — Clinical Social Work Note (Signed)
Clinical Social Work  Pt has been referred to [facility] for SNF placement.   CSW provided [employer] with requested clinicals. At time of naote, [facility] Evaluator is assessing pt.   CSW will continue to follow.   Dede Query, MSW, [facility] [phone]

## 2012-04-04 NOTE — Progress Notes (Signed)
Subjective: Discharge summary dictated yesterday but patient is awaiting bed placement.  Doing well overall.  No medication changes.  Vital signs stable afebrile. no new neurologic symptoms  Objective: Weight change:   Intake/Output Summary (Last 24 hours) at 04/04/12 0744 Last data filed at 04/03/12 1237  Gross per 24 hour  Intake    480 ml  Output      0 ml  Net    480 ml   Filed Vitals:   04/03/12 1420 04/03/12 1950 04/03/12 2119 04/04/12 0628  BP: 112/61 116/66 110/62 123/59  Pulse: 99 95 85 89  Temp: 98.8 F (37.1 C) 98.1 F (36.7 C) 97.9 F (36.6 C) 97.7 F (36.5 C)  TempSrc:   Oral Oral  Resp: 20 20 20 18   Height:      Weight:      SpO2: 99% 99% 98% 98%   General Appearance: Alert, cooperative, no distress, appears stated age  Head: Normocephalic, without obvious abnormality, atraumatic  Neck: Supple, symmetrical  Lungs: Clear to auscultation bilaterally, respirations unlabored  Heart: Regular rate and rhythm, S1 and S2 normal, no murmur, rub or gallop  Abdomen: Soft, non-tender, bowel sounds active all four quadrants, no masses, no organomegaly  Extremities: Extremities normal, atraumatic, no cyanosis or edema  Pulses: 2+ and symmetric all extremities  Skin: Skin color, texture, turgor normal, no rashes or lesions  Neuro: Alert and nonfocal   Lab Results: No results found for this basename: NA:2,K:2,CL:2,CO2:2,GLUCOSE:2,BUN:2,CREATININE:2,CALCIUM:2,MG:2,PHOS:2 in the last 72 hours  Basename 04/01/12 0838  AST 15  ALT 14  ALKPHOS 46  BILITOT 0.5  PROT 7.8  ALBUMIN 3.8   No results found for this basename: LIPASE:2,AMYLASE:2 in the last 72 hours No results found for this basename: WBC:2,NEUTROABS:2,HGB:2,HCT:2,MCV:2,PLT:2 in the last 72 hours No results found for this basename: CKTOTAL:3,CKMB:3,CKMBINDEX:3,TROPONINI:3 in the last 72 hours No components found with this basename: POCBNP:3 No results found for this basename: DDIMER:2 in the last 72 hours No  results found for this basename: HGBA1C:2 in the last 72 hours No results found for this basename: CHOL:2,HDL:2,LDLCALC:2,TRIG:2,CHOLHDL:2,LDLDIRECT:2 in the last 72 hours No results found for this basename: TSH,T4TOTAL,FREET3,T3FREE,THYROIDAB in the last 72 hours No results found for this basename: VITAMINB12:2,FOLATE:2,FERRITIN:2,TIBC:2,IRON:2,RETICCTPCT:2 in the last 72 hours  Studies/Results: No results found. Medications: Scheduled Meds:   . divalproex  250 mg Oral BID  . enoxaparin (LOVENOX) injection  40 mg Subcutaneous Q24H  . fenofibrate  160 mg Oral Daily  . insulin aspart  0-9 Units Subcutaneous TID WC  . lisinopril  2.5 mg Oral Daily  . PARoxetine  20 mg Oral Daily  . pioglitazone  30 mg Oral Q breakfast  . primidone  250 mg Oral QHS  . sodium chloride  3 mL Intravenous Q12H   Continuous Infusions:  PRN Meds:.acetaminophen  Assessment/Plan:  Active Problems:  Mental retardation - stable  Abnormal involuntary movements - no recurrence. Plan to discharge to skilled nursing facility for further observation and therapy with hopeful discharge home in one to 2 weeks Dizziness - symptomatically better but not resolved  Diabetes mellitus - blood sugar okay Disposition - hopefully to skilled nursing facility today       LOS: 4 days   Todd Padilla,Todd Padilla 04/04/2012, 7:44 AM

## 2012-04-04 NOTE — Progress Notes (Signed)
Todd Padilla, [place] [phone] 04/04/2012

## 2012-04-04 NOTE — Progress Notes (Signed)
Physical Therapy Treatment Patient Details Name: Joseh Sjogren MRN: [id number] DOB: 1950-08-23 Today's Date: 04/04/2012 Time: 1203-1218 PT Time Calculation (min): 15 min  PT Assessment / Plan / Recommendation Comments on Treatment Session  Pt. presents to be moving well with RW for stability and to help with steadiness, but given VC throughout PT session for safety and guarding for safety and steadiness. Would continue PT's recommendation for 24/hr supervision for his safety at this time.    Follow Up Recommendations  SNF;Supervision/Assistance - 24 hour     Does the patient have the potential to tolerate intense rehabilitation     Barriers to Discharge        Equipment Recommendations       Recommendations for Other Services    Frequency Min 3X/week   Plan      Precautions / Restrictions Precautions Precautions: Fall Restrictions Weight Bearing Restrictions: No   Pertinent Vitals/Pain Patient had no reports of pain during this session.    Mobility  Bed Mobility Bed Mobility: Not assessed Transfers Transfers: Sit to Stand;Stand to Sit Sit to Stand: 4: Min guard;With upper extremity assist;With armrests;From chair/3-in-1 Stand to Sit: 4: Min guard;With upper extremity assist;With armrests;To chair/3-in-1 Details for Transfer Assistance: Pt. min guard for safety and steadiness and given VC to have RW in front of him before attempting to stand Ambulation/Gait Ambulation/Gait Assistance: 4: Min guard Ambulation Distance (Feet): 250 Feet Assistive device: Rolling walker Ambulation/Gait Assistance Details: Ambulated in hallway with RW. Pt. given VC for safe use of RW, hand placement, and staying close to the RW. Pt. with frequent head turns with ambulation. Pt. could not maintain ambulation with sustained head turns. pt. encouraged to walk a little faster and to not push down so much into the RW causing his hands to become tired (per pt. report) and his shoulders to hike.    Gait Pattern: Step-through pattern;Narrow base of support Gait velocity: Slightly decreased Stairs: No Wheelchair Mobility Wheelchair Mobility: No      PT Goals Acute Rehab PT Goals PT Goal Formulation: With family Time For Goal Achievement: 04/16/12 Potential to Achieve Goals: Good Pt will go Supine/Side to Sit: with modified independence;with HOB 0 degrees Pt will go Sit to Stand: with modified independence PT Goal: Sit to Stand - Progress: Progressing toward goal Pt will Transfer Bed to Chair/Chair to Bed: with modified independence Pt will Ambulate: >150 feet;with modified independence;with least restrictive assistive device PT Goal: Ambulate - Progress: Progressing toward goal  Visit Information  Last PT Received On: 04/04/12 Assistance Needed: +1    Subjective Data  Subjective: "I might get to leave tomorrow" Patient Stated Goal: Pt desires to go home.   Cognition  Overall Cognitive Status: History of cognitive impairments - at baseline Arousal/Alertness: Awake/alert Orientation Level: Appears intact for tasks assessed Behavior During Session: [facility] for tasks performed    Balance     End of Session PT - End of Session Equipment Utilized During Treatment: Gait belt Activity Tolerance: Patient tolerated treatment well Patient left: in chair;with call bell/phone within reach;with family/visitor present (Pt's sister) Nurse Communication: Mobility status    Mertie Clause, SPTA 04/04/2012, 12:52 PM

## 2012-04-05 LAB — GLUCOSE, CAPILLARY: Glucose-Capillary: 191 mg/dL — ABNORMAL HIGH (ref 70–99)

## 2012-04-05 NOTE — Clinical Social Work Note (Signed)
Clinical Social Work   Pt is ready for to discharge to [facility]. [facility] # has been obtained. [facility] have received discharge summary and is to accept pt. Pt and family are agreeable to discharge plan. Pt's sister will provide transportation to facility. CSW is signing off as no further needs identified.   Dede Query, MSW, [facility] [phone]

## 2012-04-05 NOTE — Care Management Note (Signed)
    Page 1 of 1   04/05/2012     2:54:20 PM   CARE MANAGEMENT NOTE 04/05/2012  Patient:  Todd Padilla, Todd Padilla   Account Number:  [id number]  Date Initiated:  04/03/2012  Documentation initiated by:  Isidoro Donning  Subjective/Objective Assessment:   seizures     Action/Plan:   Anticipated DC Date:  04/05/2012   Anticipated DC Plan:  SKILLED NURSING FACILITY  In-house referral  Clinical Social Worker      DC Planning Services  CM consult      Choice offered to / List presented to:             Status of service:  Completed, signed off Medicare Important Message given?   (If response is "NO", the following Medicare IM given date fields will be blank) Date Medicare IM given:   Date Additional Medicare IM given:    Discharge Disposition:  SKILLED NURSING FACILITY  Per UR Regulation:  Reviewed for med. necessity/level of care/duration of stay  If discussed at Long Length of Stay Meetings, dates discussed:    Comments:  04/03/2012 1015 NCM spoke to pt's legal guardian/sister, Merrily Brittle, # [phone]. States she wanted an idea when pt will be discharged to SNF-rehab. NCM explained CSW will discuss with her the process and what is needed to complete his d/c to SNF. Sister requested call from CSW. NCM spoke to CSW and she will follow up with sister about d/c plans. Isidoro Donning RN CCM Case M gmt phone [phone]

## 2012-04-05 NOTE — Progress Notes (Signed)
Subjective: Patient stabilizing.  Awaiting [facility] clearance for discharge to SNF  Objective: Weight change:   Intake/Output Summary (Last 24 hours) at 04/05/12 0755 Last data filed at 04/04/12 1244  Gross per 24 hour  Intake    720 ml  Output      0 ml  Net    720 ml   Filed Vitals:   04/04/12 0628 04/04/12 1420 04/04/12 2132 04/05/12 0558  BP: 123/59 121/65 120/67 126/65  Pulse: 89 80 88 91  Temp: 97.7 F (36.5 C) 97.8 F (36.6 C) 98.4 F (36.9 C) 98 F (36.7 C)  TempSrc: Oral Oral Oral Oral  Resp: 18 18 20 20   Height:      Weight:      SpO2: 98% 98% 97% 100%   General Appearance: Alert, cooperative, no distress, appears stated age  Head: Normocephalic, without obvious abnormality, atraumatic  Neck: Supple, symmetrical  Lungs: Clear to auscultation bilaterally, respirations unlabored  Heart: Regular rate and rhythm, S1 and S2 normal, no murmur, rub or gallop  Abdomen: Soft, non-tender, bowel sounds active all four quadrants, no masses, no organomegaly  Extremities: Extremities normal, atraumatic, no cyanosis or edema  Pulses: 2+ and symmetric all extremities  Skin: Skin color, texture, turgor normal, no rashes or lesions  Neuro: Alert and nonfocal   Lab Results: No results found for this basename: NA:2,K:2,CL:2,CO2:2,GLUCOSE:2,BUN:2,CREATININE:2,CALCIUM:2,MG:2,PHOS:2 in the last 72 hours No results found for this basename: AST:2,ALT:2,ALKPHOS:2,BILITOT:2,PROT:2,ALBUMIN:2 in the last 72 hours No results found for this basename: LIPASE:2,AMYLASE:2 in the last 72 hours No results found for this basename: WBC:2,NEUTROABS:2,HGB:2,HCT:2,MCV:2,PLT:2 in the last 72 hours No results found for this basename: CKTOTAL:3,CKMB:3,CKMBINDEX:3,TROPONINI:3 in the last 72 hours No components found with this basename: POCBNP:3 No results found for this basename: DDIMER:2 in the last 72 hours No results found for this basename: HGBA1C:2 in the last 72 hours No results found for this  basename: CHOL:2,HDL:2,LDLCALC:2,TRIG:2,CHOLHDL:2,LDLDIRECT:2 in the last 72 hours No results found for this basename: TSH,T4TOTAL,FREET3,T3FREE,THYROIDAB in the last 72 hours No results found for this basename: VITAMINB12:2,FOLATE:2,FERRITIN:2,TIBC:2,IRON:2,RETICCTPCT:2 in the last 72 hours  Studies/Results: No results found. Medications: Scheduled Meds:   . divalproex  250 mg Oral BID  . enoxaparin (LOVENOX) injection  40 mg Subcutaneous Q24H  . fenofibrate  160 mg Oral Daily  . insulin aspart  0-9 Units Subcutaneous TID WC  . lisinopril  2.5 mg Oral Daily  . PARoxetine  20 mg Oral Daily  . pioglitazone  30 mg Oral Q breakfast  . primidone  250 mg Oral QHS  . sodium chloride  3 mL Intravenous Q12H   Continuous Infusions:  PRN Meds:.acetaminophen  Assessment/Plan:  Active Problems:  Mental retardation - stable  Abnormal involuntary movements - no recurrence. Plan to discharge to skilled nursing facility for further observation and therapy with hopeful discharge home in one to 2 weeks  Dizziness - symptomatically better but not resolved  Diabetes mellitus - blood sugar okay  Disposition - hopefully to skilled nursing facility today   LOS: 5 days   Todd Padilla,Todd Padilla 04/05/2012, 7:55 AM

## 2012-04-05 NOTE — Progress Notes (Signed)
Physical Therapy Treatment Patient Details Name: Todd Padilla MRN: [id number] DOB: 06/24/1950 Today's Date: 04/05/2012 Time: 2952-8413 PT Time Calculation (min): 11 min  PT Assessment / Plan / Recommendation Comments on Treatment Session  Pt. presents to be moving well when OOB with the RW. Pt. min guard for safety and steadiness around distractions as pt. is easily distracted during ambulation. Pt. given VC throughout session to keep his hands on RW when he was mobile and encouraged to stop if he needed to lift his hands. Pt's sister/guardian reports that he should be heading over to the [facility] this afternoon.    Follow Up Recommendations  SNF;Supervision/Assistance - 24 hour     Does the patient have the potential to tolerate intense rehabilitation     Barriers to Discharge        Equipment Recommendations       Recommendations for Other Services    Frequency Min 3X/week   Plan      Precautions / Restrictions Precautions Precautions: Fall Restrictions Weight Bearing Restrictions: No   Pertinent Vitals/Pain Patient did not report of any pain at this time of PT session.    Mobility  Bed Mobility Bed Mobility: Not assessed Transfers Transfers: Sit to Stand;Stand to Sit Sit to Stand: 4: Min guard;With upper extremity assist;From chair/3-in-1 Stand to Sit: 4: Min guard;With upper extremity assist;To chair/3-in-1 Details for Transfer Assistance: Pt. minguard for safety and steadiness when stepping to the RW. Pt. given VC for proper hand placement. Ambulation/Gait Ambulation/Gait Assistance: 4: Min guard Ambulation Distance (Feet): 300 Feet Assistive device: Rolling walker Ambulation/Gait Assistance Details: Pt. ambulated with RW in hallway with min guard for safety and steadiness when approached with distractions. Pt. made frequent head turns throughout session and able to maintain gait with sustained head turns, turned a 360degree circle, and had no LOB with gait  challenges.  Gait Pattern: Step-through pattern;Narrow base of support Gait velocity: slow General Gait Details: Pt. encouraged to speed up gait and would attempt, but would slow down significantly as he was around distractions.  Stairs: No Wheelchair Mobility Wheelchair Mobility: No      PT Goals Acute Rehab PT Goals PT Goal Formulation: With family Time For Goal Achievement: 04/16/12 Potential to Achieve Goals: Good Pt will go Supine/Side to Sit: with modified independence;with HOB 0 degrees Pt will go Sit to Stand: with modified independence PT Goal: Sit to Stand - Progress: Not met Pt will Transfer Bed to Chair/Chair to Bed: with modified independence Pt will Ambulate: >150 feet;with modified independence;with least restrictive assistive device PT Goal: Ambulate - Progress: Progressing toward goal  Visit Information  Last PT Received On: 04/05/12 Assistance Needed: +1    Subjective Data  Subjective: "I think I get to leave today" Patient Stated Goal: Pt desires to go home.   Cognition  Overall Cognitive Status: History of cognitive impairments - at baseline Arousal/Alertness: Awake/alert Orientation Level: Appears intact for tasks assessed Behavior During Session: [facility] for tasks performed    Balance     End of Session PT - End of Session Equipment Utilized During Treatment: Gait belt Activity Tolerance: Patient tolerated treatment well Patient left: in chair;with call bell/phone within reach;with family/visitor present (Pt's sister) Nurse Communication: Mobility status    Mertie Clause, SPTA 04/05/2012, 12:49 PM

## 2012-04-05 NOTE — Discharge Summary (Signed)
Physician Discharge Summary  NAME:Todd Padilla  WUJ:[id number]  DOB: 27-Jan-1951   Admit date: 03/31/2012 Discharge date: 04/05/2012  Discharge Diagnoses:  Active Problems:  Mental retardation - stable  Abnormal involuntary movements - resolved  Dizziness - improved  Diabetes mellitus - aware  Agitation - improved   Discharge Condition: Improved   Hospital Course: Todd Padilla is a very pleasant 61 year old male with lifelong mental retardation, adult onset type 2 diabetes mellitus, and hypertension. Over the past year or so he is at increased behavioral issues with impulsive responses. Has improved a great deal on Depakote 125 milligrams by mouth twice daily. On November 8 he was brought to the emergency room after he had some shaking movements of his arms and head that may last about a minute. It was felt that he might have seizure activity. He was evaluated and actually discharged home. On the day of admission, after breakfast, he experienced jerking thoracic movement began involving both arms and legs and head arching backwards. No eye rolling or tongue biting or bowel or bladder incontinence. He did have a blank stare during these episodes to each episode lasts about a minute and he had 6 or 8 episodes. Consistent with seizure activity. No loss of consciousness after the episodes.  While hospitalized he has not been able to perform an MRI secondary to behavior but noncontrast head CT on admission was without obvious acute change. With increase of Depakote he has been calm. Seems to be a little more dizzy than usual but is not significantly orthostatic. Was able to work with physical therapy yesterday and plans now are to transfer to [facility] skilled nursing facility in [place] for further rehabilitation and observation and therapy prior to transfer her home. Condition on discharge from the hospital is improved   Consults: Treatment Team:  Kym Groom, MD   Disposition:  Skilled nursing facility  Discharge Orders    Future Orders  Please Complete By  Expires    Diet - low sodium heart healthy      Increase activity slowly      Discharge instructions      Comments:    Notify M.D. for agitation or increased ataxia    Call MD for: temperature >100.4      Call MD for: persistant nausea and vomiting      Call MD for: difficulty breathing, headache or visual disturbances      Call MD for: persistant dizziness or light-headedness          Medication List      As of 04/03/2012 7:51 AM     TAKE these medications        DENOREX EXTRA STRENGTH 3 % Sham     Generic drug: Salicylic Acid     Apply 1 application topically 2 (two) times a week. Tues and Thurs only     divalproex 125 MG DR tablet     Commonly known as: DEPAKOTE     125 milligrams by mouth every morning and 250 milligrams by mouth every evening after supper     fenofibrate 145 MG tablet     Commonly known as: TRICOR     Take 145 mg by mouth daily.     fish oil-omega-3 fatty acids 1000 MG capsule     Take 1 g by mouth daily.     glipiZIDE 5 MG 24 hr tablet     Commonly known as: GLUCOTROL XL     Take 5 mg by mouth  daily.     ketoconazole 2 % shampoo     Commonly known as: NIZORAL     Apply 1 application topically 2 (two) times a week.     lisinopril 2.5 MG tablet     Commonly known as: PRINIVIL,ZESTRIL     Take 2.5 mg by mouth daily.     PARoxetine 20 MG tablet     Commonly known as: PAXIL     Take 20 mg by mouth daily.     pioglitazone 30 MG tablet     Commonly known as: ACTOS     Take 30 mg by mouth daily.     primidone 250 MG tablet     Commonly known as: MYSOLINE     Take 250 mg by mouth at bedtime.     sitaGLIPtan-metformin 50-1000 MG per tablet     Commonly known as: JANUMET     Take 1 tablet by mouth 2 (two) times daily with a meal.       Things to follow up in the outpatient setting: Monitor for ataxia and agitation   Time coordinating discharge: 33 minutes   The  results of significant diagnostics from this hospitalization (including imaging, microbiology, ancillary and laboratory) are listed below for reference.   Significant Diagnostic Studies:  Ct Head Wo Contrast  03/30/2012 *RADIOLOGY REPORT* Clinical Data: Shaking, concern for hypoglycemia CT HEAD WITHOUT CONTRAST Technique: Contiguous axial images were obtained from the base of the skull through the vertex without contrast. Comparison: 12/03/2008 Findings: Examination is minimally degraded secondary to patient motion artifact necessitating acquisition of additional images through the base of the skull. Grossly unchanged scattered mild periventricular hypodensity suggesting microvascular ischemic disease. Wallace Cullens white differentiation is otherwise well maintained without CT evidence of acute large territory infarct. No intraparenchymal or extra-axial mass or hemorrhage. Normal size and configuration of the ventricles and basilar cisterns. No midline shift. Limited visualization of the paranasal sinuses and mastoid air cells is normal. 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 IMPRESSION: Microvascular ischemic disease without acute intracranial process. Original Report Authenticated By: Tacey Ruiz, MD  Microbiology:  No results found for this or any previous visit (from the past 240 hour(s)).  Labs:  Results for orders placed during the hospital encounter of 03/31/12   COMPREHENSIVE METABOLIC PANEL   Component  Value  Range    Sodium  136  135 - 145 mEq/L    Potassium  4.0  3.5 - 5.1 mEq/L    Chloride  99  96 - 112 mEq/L    CO2  28  19 - 32 mEq/L    Glucose, Bld  169 (*)  70 - 99 mg/dL    BUN  23  6 - 23 mg/dL    Creatinine, Ser  4.69  0.50 - 1.35 mg/dL    Calcium  9.9  8.4 - 10.5 mg/dL    Total Protein  7.5  6.0 - 8.3 g/dL    Albumin  3.9  3.5 - 5.2 g/dL    AST  15  0 - 37 U/L    ALT  13  0 - 53 U/L    Alkaline Phosphatase  45  39 - 117 U/L    Total Bilirubin  0.4  0.3 - 1.2 mg/dL    GFR calc non Af  Amer  >90  >90 mL/min    GFR calc Af Amer  >90  >90 mL/min   URINE RAPID DRUG SCREEN (HOSP PERFORMED)   Component  Value  Range  Opiates  NONE DETECTED  NONE DETECTED    Cocaine  NONE DETECTED  NONE DETECTED    Benzodiazepines  NONE DETECTED  NONE DETECTED    Amphetamines  NONE DETECTED  NONE DETECTED    Tetrahydrocannabinol  NONE DETECTED  NONE DETECTED    Barbiturates  POSITIVE (*)  NONE DETECTED   ETHANOL   Component  Value  Range    Alcohol, Ethyl (B)  <11  0 - 11 mg/dL   VALPROIC ACID LEVEL   Component  Value  Range    Valproic Acid Lvl  14.9 (*)  50.0 - 100.0 ug/mL   CBC WITH DIFFERENTIAL   Component  Value  Range    WBC  7.5  4.0 - 10.5 K/uL    RBC  4.71  4.22 - 5.81 MIL/uL    Hemoglobin  12.5 (*)  13.0 - 17.0 g/dL    HCT  09.8 (*)  11.9 - 52.0 %    MCV  81.7  78.0 - 100.0 fL    MCH  26.5  26.0 - 34.0 pg    MCHC  32.5  30.0 - 36.0 g/dL    RDW  14.7 (*)  82.9 - 15.5 %    Platelets  231  150 - 400 K/uL    Neutrophils Relative  58  43 - 77 %    Neutro Abs  4.3  1.7 - 7.7 K/uL    Lymphocytes Relative  30  12 - 46 %    Lymphs Abs  2.3  0.7 - 4.0 K/uL    Monocytes Relative  10  3 - 12 %    Monocytes Absolute  0.8  0.1 - 1.0 K/uL    Eosinophils Relative  1  0 - 5 %    Eosinophils Absolute  0.1  0.0 - 0.7 K/uL    Basophils Relative  1  0 - 1 %    Basophils Absolute  0.1  0.0 - 0.1 K/uL   URINALYSIS, ROUTINE W REFLEX MICROSCOPIC   Component  Value  Range    Color, Urine  YELLOW  YELLOW    APPearance  CLEAR  CLEAR    Specific Gravity, Urine  1.020  1.005 - 1.030    pH  6.5  5.0 - 8.0    Glucose, UA  NEGATIVE  NEGATIVE mg/dL    Hgb urine dipstick  NEGATIVE  NEGATIVE    Bilirubin Urine  NEGATIVE  NEGATIVE    Ketones, ur  NEGATIVE  NEGATIVE mg/dL    Protein, ur  NEGATIVE  NEGATIVE mg/dL    Urobilinogen, UA  1.0  0.0 - 1.0 mg/dL    Nitrite  NEGATIVE  NEGATIVE    Leukocytes, UA  NEGATIVE  NEGATIVE   GLUCOSE, CAPILLARY   Component  Value  Range    Glucose-Capillary  171  (*)  70 - 99 mg/dL   GLUCOSE, CAPILLARY   Component  Value  Range    Glucose-Capillary  107 (*)  70 - 99 mg/dL   CK   Component  Value  Range    Total CK  74  7 - 232 U/L   SEDIMENTATION RATE   Component  Value  Range    Sed Rate  4  0 - 16 mm/hr   VITAMIN B12   Component  Value  Range    Vitamin B-12  650  211 - 911 pg/mL   TSH   Component  Value  Range    TSH  1.874  0.350 - 4.500 uIU/mL   MAGNESIUM   Component  Value  Range    Magnesium  2.1  1.5 - 2.5 mg/dL   PHOSPHORUS   Component  Value  Range    Phosphorus  3.8  2.3 - 4.6 mg/dL   CALCIUM, IONIZED   Component  Value  Range    Calcium, Ion  1.30 (*)  1.12 - 1.32 mmol/L   CBC   Component  Value  Range    WBC  8.9  4.0 - 10.5 K/uL    RBC  4.81  4.22 - 5.81 MIL/uL    Hemoglobin  12.7 (*)  13.0 - 17.0 g/dL    HCT  16.1  09.6 - 04.5 %    MCV  81.9  78.0 - 100.0 fL    MCH  26.4  26.0 - 34.0 pg    MCHC  32.2  30.0 - 36.0 g/dL    RDW  40.9 (*)  81.1 - 15.5 %    Platelets  268  150 - 400 K/uL   CK   Component  Value  Range    Total CK  72  7 - 232 U/L   GLUCOSE, CAPILLARY   Component  Value  Range    Glucose-Capillary  143 (*)  70 - 99 mg/dL   CBC   Component  Value  Range    WBC  6.8  4.0 - 10.5 K/uL    RBC  4.87  4.22 - 5.81 MIL/uL    Hemoglobin  12.6 (*)  13.0 - 17.0 g/dL    HCT  91.4  78.2 - 95.6 %    MCV  82.1  78.0 - 100.0 fL    MCH  25.9 (*)  26.0 - 34.0 pg    MCHC  31.5  30.0 - 36.0 g/dL    RDW  21.3 (*)  08.6 - 15.5 %    Platelets  255  150 - 400 K/uL   BASIC METABOLIC PANEL   Component  Value  Range    Sodium  142  135 - 145 mEq/L    Potassium  4.4  3.5 - 5.1 mEq/L    Chloride  106  96 - 112 mEq/L    CO2  27  19 - 32 mEq/L    Glucose, Bld  165 (*)  70 - 99 mg/dL    BUN  20  6 - 23 mg/dL    Creatinine, Ser  5.78  0.50 - 1.35 mg/dL    Calcium  9.5  8.4 - 10.5 mg/dL    GFR calc non Af Amer  >90  >90 mL/min    GFR calc Af Amer  >90  >90 mL/min   CREATININE, SERUM   Component  Value  Range     Creatinine, Ser  0.59  0.50 - 1.35 mg/dL    GFR calc non Af Amer  >90  >90 mL/min    GFR calc Af Amer  >90  >90 mL/min   GLUCOSE, CAPILLARY   Component  Value  Range    Glucose-Capillary  152 (*)  70 - 99 mg/dL   GLUCOSE, CAPILLARY   Component  Value  Range    Glucose-Capillary  165 (*)  70 - 99 mg/dL   HEPATIC FUNCTION PANEL   Component  Value  Range    Total Protein  7.8  6.0 - 8.3 g/dL    Albumin  3.8  3.5 - 5.2 g/dL  AST  15  0 - 37 U/L    ALT  14  0 - 53 U/L    Alkaline Phosphatase  46  39 - 117 U/L    Total Bilirubin  0.5  0.3 - 1.2 mg/dL    Bilirubin, Direct  0.1  0.0 - 0.3 mg/dL    Indirect Bilirubin  0.4  0.3 - 0.9 mg/dL   AMMONIA   Component  Value  Range    Ammonia  41  11 - 60 umol/L   GLUCOSE, CAPILLARY   Component  Value  Range    Glucose-Capillary  175 (*)  70 - 99 mg/dL   GLUCOSE, CAPILLARY   Component  Value  Range    Glucose-Capillary  199 (*)  70 - 99 mg/dL   GLUCOSE, CAPILLARY   Component  Value  Range    Glucose-Capillary  185 (*)  70 - 99 mg/dL   GLUCOSE, CAPILLARY   Component  Value  Range    Glucose-Capillary  184 (*)  70 - 99 mg/dL    Comment 1  Documented in Chart     Comment 2  Notify RN    GLUCOSE, CAPILLARY   Component  Value  Range    Glucose-Capillary  149 (*)  70 - 99 mg/dL   GLUCOSE, CAPILLARY   Component  Value  Range    Glucose-Capillary  169 (*)  70 - 99 mg/dL   GLUCOSE, CAPILLARY   Component  Value  Range    Glucose-Capillary  245 (*)  70 - 99 mg/dL    Comment 1  Documented in Chart     Comment 2  Notify RN    GLUCOSE, CAPILLARY   Component  Value  Range    Glucose-Capillary  188 (*)  70 - 99 mg/dL    Comment 1  Documented in Chart     Comment 2  Notify RN      Signed: Omer Puccinelli,Harm NEVILL 04/05/2012, 12:15 PM

## 2012-04-06 NOTE — Procedures (Signed)
EEG NUMBER:  REFERRING PHYSICIAN:  Dr. Jomarie Longs.  HISTORY:  A 60 year old male with an episode of involuntary movements evaluated to rule out seizures.  MEDICATIONS:  Depakote, Lovenox, fenofibrate, insulin, lisinopril, paroxetine, pioglitazone, primidone.  CONDITIONS OF RECORDING:  This is a 16-channel EEG carried out with the patient in the awake state.  DESCRIPTION:  The waking background activity consists of a low-voltage, symmetrical, fairly well-organized 8-9 Hz alpha activity seen from the parieto-occipital and posterotemporal regions.  Low-voltage, fast activity, poorly organized was seen and during at times, superimposed on more posterior rhythms.  A mixture of theta and alpha rhythm was seen from the central and temporal regions.  The patient does not drowse or sleep.  Hyperventilation was not performed.  Intermittent photic stimulation failed to elicit any change in the tracing.  IMPRESSION:  This is a normal awake EEG.  COMMENT:  An EEG with the patient sleep deprived to elicit drowse and light sleep maybe desirable to further elicit a possible seizure disorder.          ______________________________ Thana Farr, MD    [id number] D:  04/03/2012 09:07:25  T:  04/03/2012 22:47:21  Job #:  [id number]

## 2012-04-09 NOTE — Progress Notes (Signed)
Todd Padilla, PTA [phone]  

## 2013-04-14 IMAGING — CT CT HEAD W/O CM
2 of 4 series · 16 of 30 positions shown, 19 images · non-contrast
Comparison: 12/03/2008

CLINICAL DATA: Shaking, concern for hypoglycemia

CT HEAD WITHOUT CONTRAST
TECHNIQUE: Contiguous axial images were obtained from the base of
the skull through the vertex without contrast.

[Series 2: head w/o · axial · non-contrast · 0.43mm/px · z∈[-10,+110]mm · 9 of 30 slices shown, 12 images]
[im 3/30  brain]
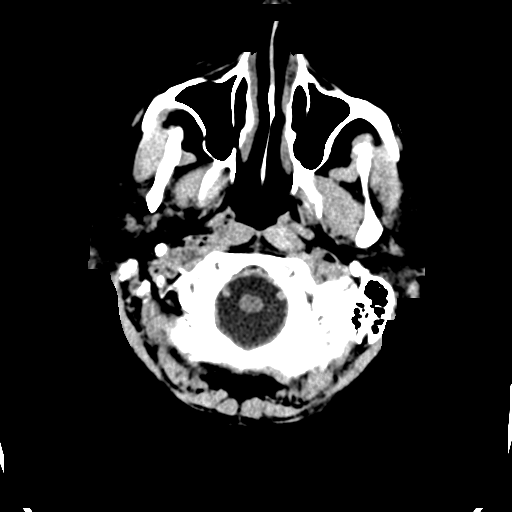
[im 3/30  bone]
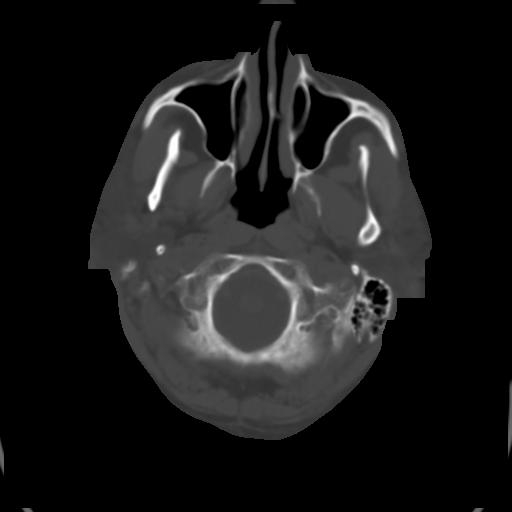
[im 6/30  brain]
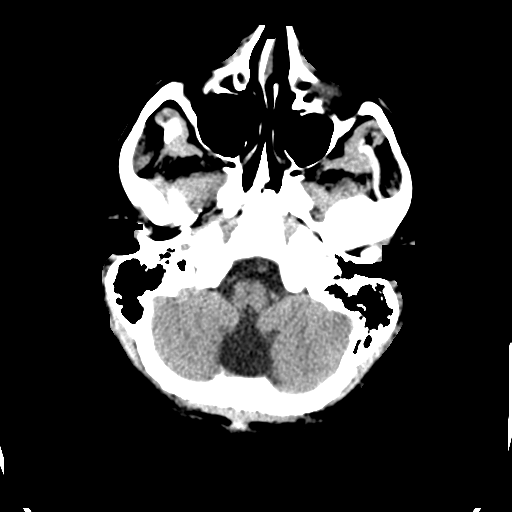
[im 9/30  brain]
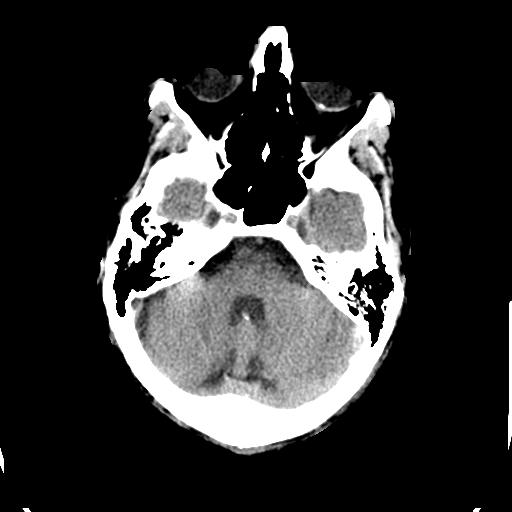
[im 12/30  brain]
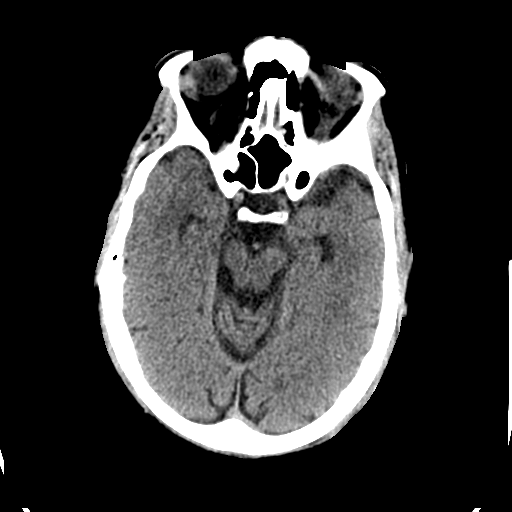
[im 15/30  brain]
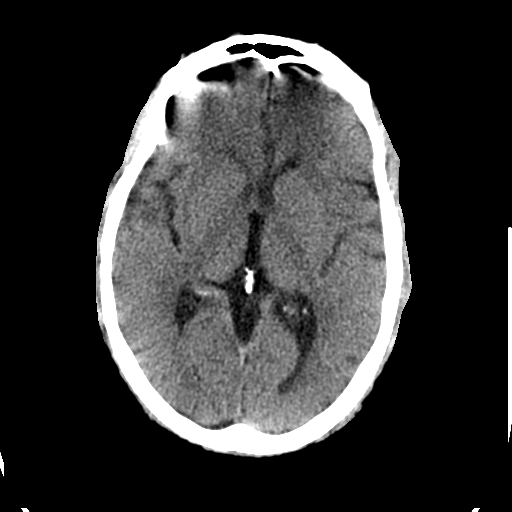
[im 15/30  bone]
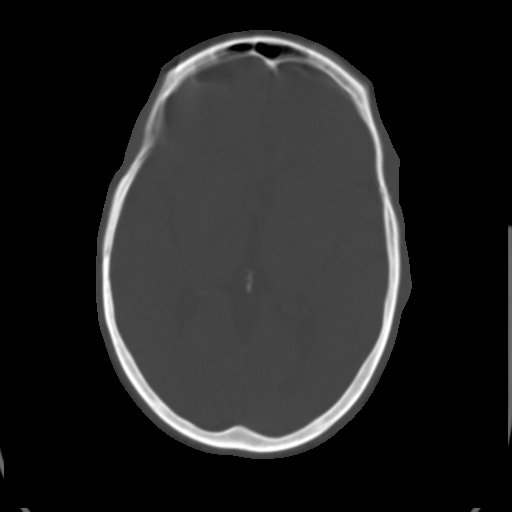
[im 18/30  brain]
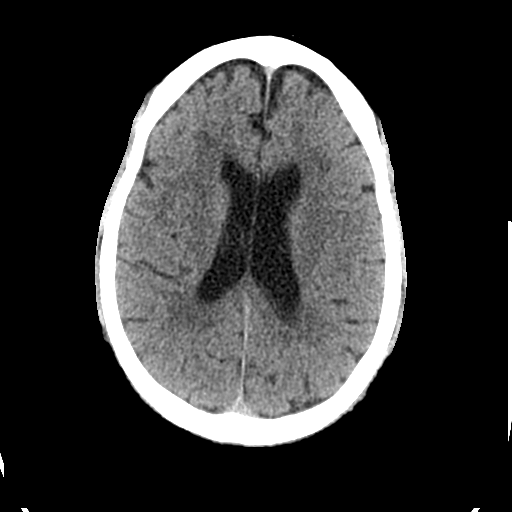
[im 21/30  brain]
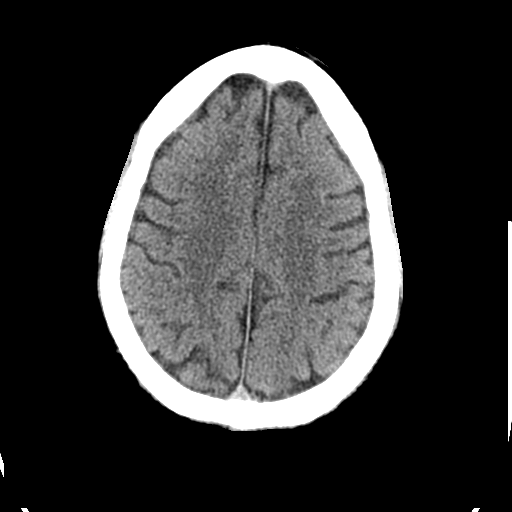
[im 24/30  brain]
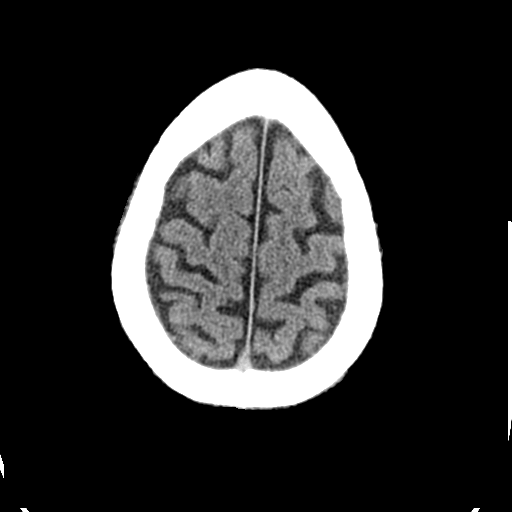
[im 27/30  brain]
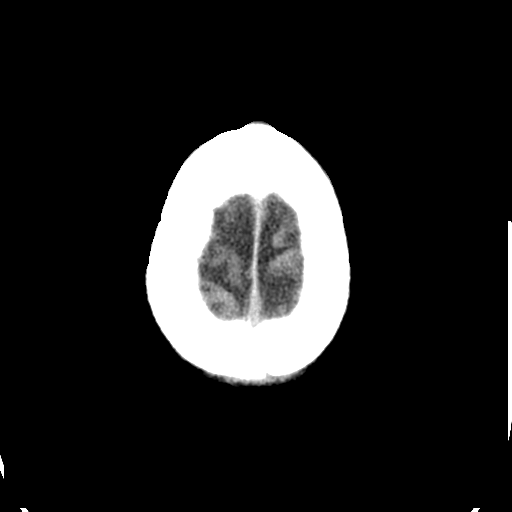
[im 27/30  bone]
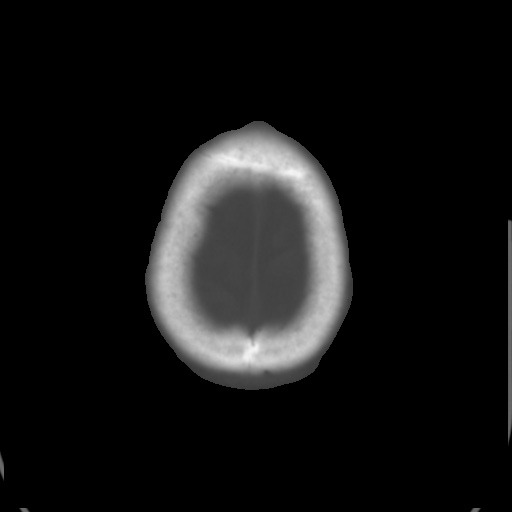

[Series 4: bone windows · axial · 0.43mm/px · z∈[-10,+94]mm · 7 of 30 slices shown]
[im 3/30  bone]
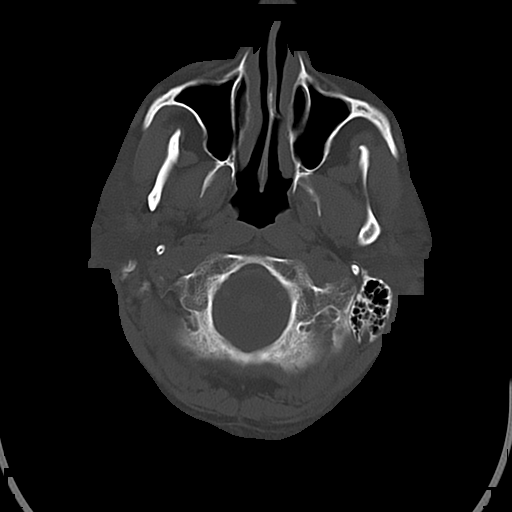
[im 6/30  bone]
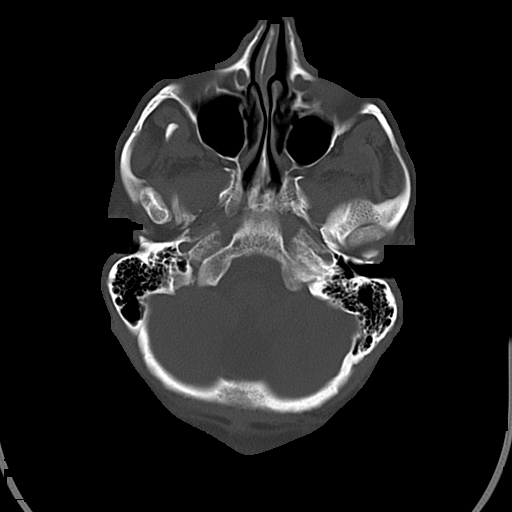
[im 9/30  bone]
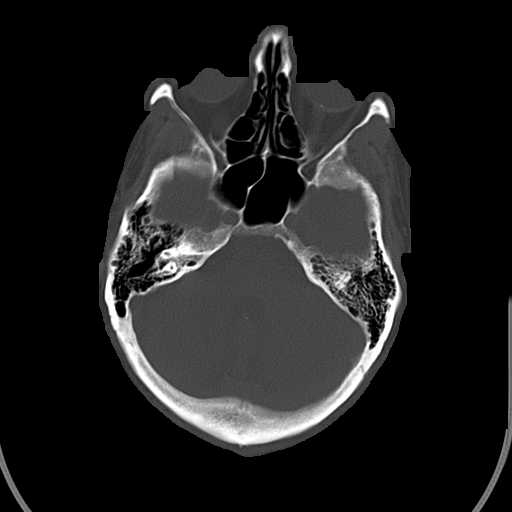
[im 12/30  bone]
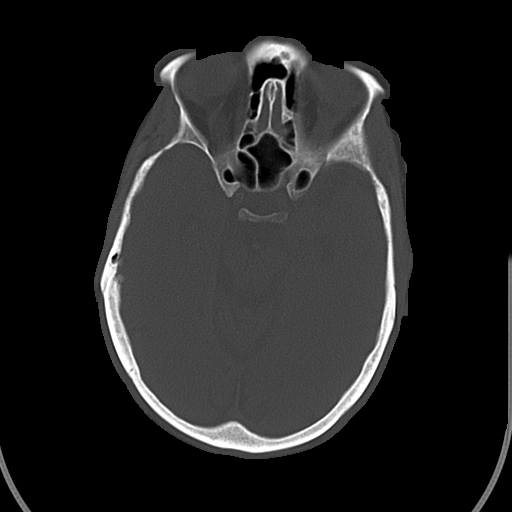
[im 18/30  bone]
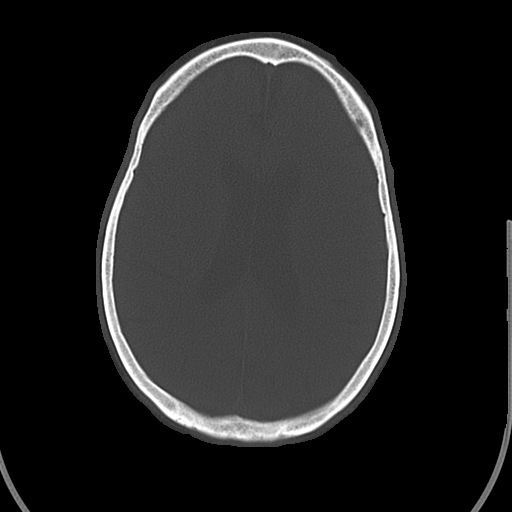
[im 21/30  bone]
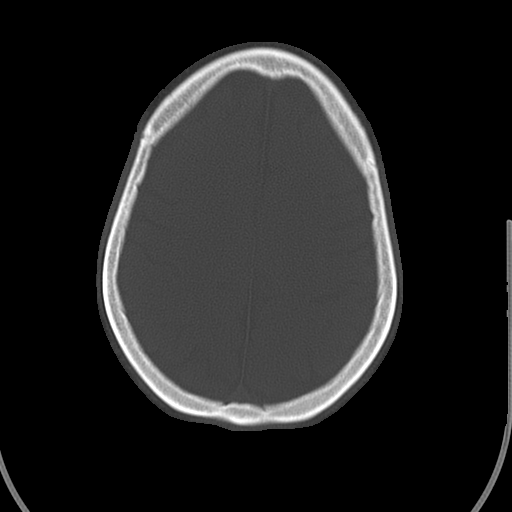
[im 24/30  bone]
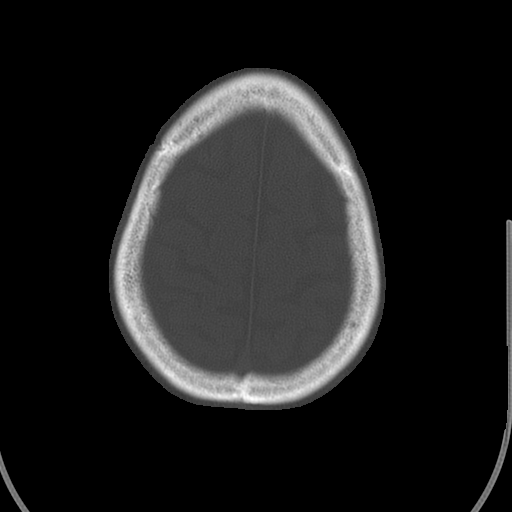

[16 of 30 positions shown; findings below may reference images not displayed]

FINDINGS: Examination is minimally degraded secondary to patient motion
artifact necessitating acquisition of additional images through the
base of the skull.

Grossly unchanged scattered mild periventricular hypodensity
suggesting microvascular ischemic disease.  Gray white
differentiation is otherwise well maintained without CT evidence of
acute large territory infarct.  No intraparenchymal or extra-axial
mass or hemorrhage.  Normal size and configuration of the
ventricles and basilar cisterns.  No midline shift.  Limited
visualization of the paranasal sinuses and mastoid air cells is
normal.  Suspected optic drusen bilaterally.
IMPRESSION: Microvascular ischemic disease without acute intracranial process.

## 2014-08-12 ENCOUNTER — Encounter: Payer: Self-pay | Admitting: Diagnostic Neuroimaging

## 2014-08-12 ENCOUNTER — Ambulatory Visit (INDEPENDENT_AMBULATORY_CARE_PROVIDER_SITE_OTHER): Payer: Medicare Other | Admitting: Diagnostic Neuroimaging

## 2014-08-12 VITALS — BP 109/64 | HR 106 | Ht 70.0 in

## 2014-08-12 DIAGNOSIS — F919 Conduct disorder, unspecified: Secondary | ICD-10-CM

## 2014-08-12 DIAGNOSIS — F79 Unspecified intellectual disabilities: Secondary | ICD-10-CM | POA: Diagnosis not present

## 2014-08-12 DIAGNOSIS — R4689 Other symptoms and signs involving appearance and behavior: Secondary | ICD-10-CM

## 2014-08-12 DIAGNOSIS — G40909 Epilepsy, unspecified, not intractable, without status epilepticus: Secondary | ICD-10-CM

## 2014-08-12 NOTE — Progress Notes (Signed)
[facility]  PATIENT: Todd Padilla DOB: July 19, 1950  REFERRING CLINICIAN: Inda Merlin  HISTORY FROM: patient and sister REASON FOR VISIT: new consult    HISTORICAL  CHIEF COMPLAINT:  Chief Complaint  Patient presents with  . New Evaluation    seizures that are worsening     HISTORY OF PRESENT ILLNESS:   64 year old right-handed male here for evaluation of seizures. Patient has history of mental retardation, intellectual disability, seizure disorder. First seizure was at less than 75 years old with shaking convulsions. He may have had encephalitis as a newborn. In 2005 patient was started on primidone for essential tremor. 2010 patient had generalized convulsions, collapse, without incontinence or tongue biting. He was tired and confused thereafter. Patient was started on Depakote.   Current seizure frequency is unclear. Recently staff at his facility have noted several breakthrough seizures in which patient had some staring and unresponsiveness without losing consciousness or collapsing. Patient having more anxiety, agitation, compulsive behavior, crying, shaking with anxiety. Behavioral outbursts and issues seem to be increasing over time.  Over the past 8 years patient has had generalized weakness, especially in his left leg.   REVIEW OF SYSTEMS: Full 14 system review of systems performed and notable only for sleepiness snoring memory loss confusion headache weakness dizziness slurred speech tremor depression anxiety disinterest activity strip pain cramps achy muscles fatigue rash urination problem urinary incontinence.  ALLERGIES: No Known Allergies  HOME MEDICATIONS: Outpatient Prescriptions Prior to Visit  Medication Sig Dispense Refill  . fenofibrate (TRICOR) 145 MG tablet Take 145 mg by mouth daily.    . fish oil-omega-3 fatty acids 1000 MG capsule Take 1 g by mouth daily.    Marland Kitchen ketoconazole (NIZORAL) 2 % shampoo Apply 1 application topically 2 (two) times a  week.    Marland Kitchen lisinopril (PRINIVIL,ZESTRIL) 2.5 MG tablet Take 2.5 mg by mouth daily.    . Salicylic Acid (DENOREX EXTRA STRENGTH) 3 % SHAM Apply 1 application topically 2 (two) times a week. Tues and Thurs only    . sitaGLIPtan-metformin (JANUMET) 50-1000 MG per tablet Take 1 tablet by mouth 2 (two) times daily with a meal.    . divalproex (DEPAKOTE) 125 MG DR tablet 125 milligrams by mouth every morning and 250 milligrams by mouth every evening after supper 90 tablet 1  . glipiZIDE (GLUCOTROL XL) 5 MG 24 hr tablet Take 5 mg by mouth daily.    Marland Kitchen PARoxetine (PAXIL) 20 MG tablet Take 20 mg by mouth daily.    . pioglitazone (ACTOS) 30 MG tablet Take 30 mg by mouth daily.    . primidone (MYSOLINE) 250 MG tablet Take 250 mg by mouth at bedtime.     No facility-administered medications prior to visit.    PAST MEDICAL HISTORY: Past Medical History  Diagnosis Date  . Hypertension   . Depression   . Diabetes mellitus without complication   . Seizures   . Headache(784.0)   . Cancer   . Arthritis   . Mental retardation since birth    PAST SURGICAL HISTORY: Past Surgical History  Procedure Laterality Date  . Hernia repair      FAMILY HISTORY: Family History  Problem Relation Age of Onset  . Stroke Father   . Stroke Other     fathers brother  . Stroke Other     fathers sister    SOCIAL HISTORY:  History   Social History  . Marital Status: Married    Spouse Name: N/A  . Number of  Children: 0  . Years of Education: na   Occupational History  . disabled    Social History Main Topics  . Smoking status: Never Smoker   . Smokeless tobacco: Never Used  . Alcohol Use: No  . Drug Use: No  . Sexual Activity: No   Other Topics Concern  . Not on file   Social History Narrative   Lives in nursing home    Wheelchair bound   Drinks caffeine daily 1-2 cups      PHYSICAL EXAM  Filed Vitals:   08/12/14 1110  BP: 109/64  Pulse: 106  Height: 5\' 10"  (1.778 m)    Body mass  index is 0.00 kg/(m^2).  No exam data present  No flowsheet data found.  GENERAL EXAM: Patient is in no distress; well developed, nourished and groomed; neck is supple  CARDIOVASCULAR: Regular rate and rhythm, no murmurs, no carotid bruits  NEUROLOGIC: MENTAL STATUS: awake, alert, oriented to person, "[place]", NOT TO [place]. DECR MEMORY, ATTENTION, FLUENCY; FOLLOWS SIMPLE COMMANDS. NAMES SIMPLE OBJECTS. DECR FUND OF KNOWLEDGE. MILD-MOD MENTAL RETARDATION. SLOW RESPONSES. CRANIAL NERVE: no papilledema on fundoscopic exam, pupils equal and reactive to light, visual fields full to confrontation, extraocular muscles intact, no nystagmus, facial sensation and strength symmetric, hearing intact, palate elevates symmetrically, uvula midline, shoulder shrug symmetric, tongue midline. MOTOR: normal bulk and tone, full strength in the BUE, BLE SENSORY: normal and symmetric to light touch; DECR VIB AT TOES/ANKLES COORDINATION: finger-nose-finger, fine finger movements normal REFLEXES: BUE 2, KNEES 1, ANKLES 0 GAIT/STATION: IN WHEELCHAIR; UNSTEADY WHEN TRYING TO STAND    DIAGNOSTIC DATA (LABS, IMAGING, TESTING) - I reviewed patient records, labs, notes, testing and imaging myself where available.  Lab Results  Component Value Date   WBC 6.8 04/01/2012   HGB 12.6* 04/01/2012   HCT 40.0 04/01/2012   MCV 82.1 04/01/2012   PLT 255 04/01/2012      Component Value Date/Time   NA 142 04/01/2012 0535   K 4.4 04/01/2012 0535   CL 106 04/01/2012 0535   CO2 27 04/01/2012 0535   GLUCOSE 165* 04/01/2012 0535   BUN 20 04/01/2012 0535   CREATININE 0.63 04/01/2012 0535   CALCIUM 9.5 04/01/2012 0535   PROT 7.8 04/01/2012 0838   ALBUMIN 3.8 04/01/2012 0838   AST 15 04/01/2012 0838   ALT 14 04/01/2012 0838   ALKPHOS 46 04/01/2012 0838   BILITOT 0.5 04/01/2012 0838   GFRNONAA >90 04/01/2012 0535   GFRAA >90 04/01/2012 0535   No results found for: CHOL,  HDL, LDLCALC, LDLDIRECT, TRIG, CHOLHDL No results found for: HGBA1C Lab Results  Component Value Date   VITAMINB12 650 03/31/2012   Lab Results  Component Value Date   TSH 1.874 03/31/2012    12/03/08 CT head - Atrophy and chronic appearing small vessel changes within the white matter. No acute finding evident. [I reviewed images myself and agree with interpretation. 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 -VRP]      ASSESSMENT AND PLAN  64 y.o. 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 Patient having increasing episodes suspicious for seizure versus behavioral spells. May consider increasing Depakote for better control of mood, behavior and seizure. Will request staff to start a separate seizure log to monitor these events (description and frequency).  PLAN: - consider increasing depakote to 750mg  - 1000mg  daily (will request SNF attending/NP to arrange  and monitor) - ask SNF staff to maintain separate seizure log to bring to next visit to track frequency and details of seizures  Return in about 3 months (around 11/12/2014).    Penni Bombard, MD 11/09/3557, 74:16 PM Certified in Neurology, Neurophysiology and Neuroimaging  [facility] [address], [place], [place] [address] [phone]

## 2014-08-12 NOTE — Patient Instructions (Signed)
Consider increasing depakote up to 750mg  - 1000mg  daily.  Monitor for side effects and benefit.

## 2014-08-14 ENCOUNTER — Encounter: Payer: Self-pay | Admitting: Diagnostic Neuroimaging

## 2014-11-11 ENCOUNTER — Ambulatory Visit (INDEPENDENT_AMBULATORY_CARE_PROVIDER_SITE_OTHER): Payer: Medicare Other | Admitting: Diagnostic Neuroimaging

## 2014-11-11 ENCOUNTER — Encounter: Payer: Self-pay | Admitting: Diagnostic Neuroimaging

## 2014-11-11 VITALS — BP 114/68 | HR 110 | Ht 70.0 in

## 2014-11-11 DIAGNOSIS — G40909 Epilepsy, unspecified, not intractable, without status epilepticus: Secondary | ICD-10-CM

## 2014-11-11 DIAGNOSIS — F79 Unspecified intellectual disabilities: Secondary | ICD-10-CM

## 2014-11-11 NOTE — Progress Notes (Signed)
[facility]  PATIENT: Todd Padilla DOB: [birth year]  REFERRING CLINICIAN: Inda Merlin  HISTORY FROM: patient and sister REASON FOR VISIT: follow up   HISTORICAL  CHIEF COMPLAINT:  Chief Complaint  Patient presents with  . Seizures    rm 7, sister/guardian Linda  . Follow-up    lives at [place]:   UPDATE 11/11/14: Since last visit, depakote dosing has been increased to 500mg  BID. Now doing better with seizure control (4 sz since last visit; 4/11, 5/21, 5/27, 11/06/14; staring, unable to speak, left leg shaking). Previously 2-3 sz per week. More sleepiness in AM.   PRIOR HPI (08/12/14): 64 year old right-handed male here for evaluation of seizures. Patient has history of mental retardation, intellectual disability, seizure disorder. First seizure was at less than 26 years old with shaking convulsions. He may have had encephalitis as a newborn. In 2005 patient was started on primidone for essential tremor. 2010 patient had generalized convulsions, collapse, without incontinence or tongue biting. He was tired and confused thereafter. Patient was started on Depakote. Current seizure frequency is unclear. Recently staff at his facility have noted several breakthrough seizures in which patient had some staring and unresponsiveness without losing consciousness or collapsing. Patient having more anxiety, agitation, compulsive behavior, crying, shaking with anxiety. Behavioral outbursts and issues seem to be increasing over time. Over the past 8 years patient has had generalized weakness, especially in his left leg.   REVIEW OF SYSTEMS: Full 14 system review of systems performed and notable only for decr activity diff urinating agitation confusion depression seizure depression.   ALLERGIES: No Known Allergies  HOME MEDICATIONS: Outpatient Prescriptions Prior to Visit  Medication Sig Dispense Refill  . acetaminophen (TYLENOL) 325 MG tablet Take  650 mg by mouth every 4 (four) hours as needed for fever or headache.    Marland Kitchen atorvastatin (LIPITOR) 80 MG tablet Take 80 mg by mouth at bedtime.     . chlorhexidine (PERIDEX) 0.12 % solution Use as directed 15 mLs in the mouth or throat 2 (two) times daily. Hold 30 seconds    . clonazePAM (KLONOPIN) 0.5 MG tablet Take 0.5 mg by mouth at bedtime.     . divalproex (DEPAKOTE) 500 MG DR tablet Take 500 mg by mouth at bedtime. Depakote DR 500mg  PO QAM    . docusate sodium (COLACE) 100 MG capsule Take 100 mg by mouth daily.     . fenofibrate (TRICOR) 145 MG tablet Take 145 mg by mouth daily. Not on MAR from nursing home    . fish oil-omega-3 fatty acids 1000 MG capsule Take 1 g by mouth daily. Not on MAR from nursing home    . Fluvoxamine Maleate 150 MG CP24 Take 1 capsule by mouth 2 (two) times daily.    . INVOKANA 300 MG TABS tablet Take 300 mg by mouth daily before breakfast.     . JENTADUETO 2.5-500 MG TABS Take 1 tablet by mouth 2 (two) times daily.     Marland Kitchen ketoconazole (NIZORAL) 2 % shampoo Apply 1 application topically 2 (two) times a week. Not on MAR from nursing home    . LANTUS 100 UNIT/ML injection Inject 30 Units into the skin at bedtime. 15 units subq every AM 30 units subq every PM    . lisinopril (PRINIVIL,ZESTRIL) 2.5 MG tablet Take 2.5 mg by mouth daily.    Marland Kitchen LORazepam (ATIVAN) 1 MG tablet Take 2 mg by mouth every 6 (six) hours as needed for  anxiety.     . Multiple Vitamin (MULTIVITAMIN WITH MINERALS) TABS tablet Take 1 tablet by mouth daily.    Vladimir Faster Glycol-Propyl Glycol 0.4-0.3 % SOLN Apply 1 drop to eye 2 (two) times daily.    . polyethylene glycol (MIRALAX / GLYCOLAX) packet Take 17 g by mouth daily.    . primidone (MYSOLINE) 50 MG tablet Take 50 mg by mouth at bedtime.     Marland Kitchen QUEtiapine Fumarate (SEROQUEL XR) 150 MG 24 hr tablet Take 150 mg by mouth 2 (two) times daily.    Marland Kitchen saccharomyces boulardii (FLORASTOR) 250 MG capsule Take 250 mg by mouth every morning. Not on MAR from  nursing home    . Salicylic Acid (DENOREX EXTRA STRENGTH) 3 % SHAM Apply 1 application topically 2 (two) times a week. Tues and Thurs only  Not on MAR from nursing home    . sitaGLIPtan-metformin (JANUMET) 50-1000 MG per tablet Take 1 tablet by mouth 2 (two) times daily with a meal. Not on MAR from nursing home    . tobramycin-dexamethasone (TOBRADEX) ophthalmic solution Not on MAR from nursing home    . ZETIA 10 MG tablet Take 10 mg by mouth daily.     . Cholecalciferol 25000 UNITS CAPS Take 2 capsules by mouth every 28 (twenty-eight) days.    . clonazePAM (KLONOPIN) 1 MG tablet Take 1 mg by mouth at bedtime.      No facility-administered medications prior to visit.    PAST MEDICAL HISTORY: Past Medical History  Diagnosis Date  . Hypertension   . Depression   . Diabetes mellitus without complication   . Headache(784.0)   . Cancer   . Arthritis   . Mental retardation since birth  . Seizures     last sz 11/06/14    PAST SURGICAL HISTORY: Past Surgical History  Procedure Laterality Date  . Hernia repair      FAMILY HISTORY: Family History  Problem Relation Age of Onset  . Stroke Father   . Stroke Other     fathers brother  . Stroke Other     fathers sister    SOCIAL HISTORY:  History   Social History  . Marital Status: Married    Spouse Name: N/A  . Number of Children: 0  . Years of Education: na   Occupational History  . disabled    Social History Main Topics  . Smoking status: Never Smoker   . Smokeless tobacco: Never Used  . Alcohol Use: No  . Drug Use: No  . Sexual Activity: No   Other Topics Concern  . Not on file   Social History Narrative   Lives in nursing home    Wheelchair bound   Drinks caffeine daily 1-2 cups      PHYSICAL EXAM  Filed Vitals:   11/11/14 1104  BP: 114/68  Pulse: 110  Height: 5\' 10"  (1.778 m)    There is no weight on file to calculate BMI.  No exam data present  No flowsheet data found.  GENERAL  EXAM: Patient is in no distress; well developed, nourished and groomed; neck is supple  CARDIOVASCULAR: Regular rate and rhythm, no murmurs, no carotid bruits  NEUROLOGIC: MENTAL STATUS: awake, alert, DECR MEMORY, ATTENTION, FLUENCY; FOLLOWS SIMPLE COMMANDS. NAMES SIMPLE OBJECTS. DECR FUND OF KNOWLEDGE. MILD-MOD MENTAL RETARDATION. SLOW RESPONSES. CRANIAL NERVE: pupils equal and reactive to light, visual fields full to confrontation, extraocular muscles intact, no nystagmus, facial sensation and strength symmetric, hearing intact, palate elevates symmetrically,  uvula midline, shoulder shrug symmetric, tongue midline. MOTOR: normal bulk and tone; BUE 4; RLE 3-4, LLE 2-3 SENSORY: normal and symmetric to light touch; DECR VIB AT TOES/ANKLES COORDINATION: finger-nose-finger, fine finger movements normal REFLEXES: BUE 2, KNEES 1, ANKLES 0 GAIT/STATION: IN WHEELCHAIR    DIAGNOSTIC DATA (LABS, IMAGING, TESTING) - I reviewed patient records, labs, notes, testing and imaging myself where available.  Lab Results  Component Value Date   WBC 6.8 04/01/2012   HGB 12.6* 04/01/2012   HCT 40.0 04/01/2012   MCV 82.1 04/01/2012   PLT 255 04/01/2012      Component Value Date/Time   NA 142 04/01/2012 0535   K 4.4 04/01/2012 0535   CL 106 04/01/2012 0535   CO2 27 04/01/2012 0535   GLUCOSE 165* 04/01/2012 0535   BUN 20 04/01/2012 0535   CREATININE 0.63 04/01/2012 0535   CALCIUM 9.5 04/01/2012 0535   PROT 7.8 04/01/2012 0838   ALBUMIN 3.8 04/01/2012 0838   AST 15 04/01/2012 0838   ALT 14 04/01/2012 0838   ALKPHOS 46 04/01/2012 0838   BILITOT 0.5 04/01/2012 0838   GFRNONAA >90 04/01/2012 0535   GFRAA >90 04/01/2012 0535   No results found for: CHOL, HDL, LDLCALC, LDLDIRECT, TRIG, CHOLHDL No results found for: HGBA1C Lab Results  Component Value Date   VITAMINB12 650 03/31/2012   Lab Results  Component Value Date   TSH 1.874 03/31/2012    12/03/08 CT head - Atrophy and chronic  appearing small vessel changes within the white matter. No acute finding evident.   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     ASSESSMENT AND PLAN  64 y.o. 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 Patient having increasing episodes suspicious for seizure versus behavioral spells. May consider increasing Depakote for better control of mood, behavior and seizure. Appreciate staff maintaining separate seizure log to monitor these events (description and frequency).  PLAN: I spent 15 minutes of face to face time with patient. Greater than 50% of time was spent in counseling and coordination of care with patient. In summary we discussed:  - consider increasing depakote to 750mg  BID for better mood and seizure control, to be balanced with potential side effects of sedation/sleepiness; will request SNF attending/NP to arrange and monitor labs (CBC, CMP, ammonia levels q6 months) - continue to maintain separate seizure log to bring to next visit to track frequency and details of seizures (per SNF staff)  Return in about 6 months (around 05/13/2015).    Penni Bombard, MD 1/60/7371, 06:26 AM Certified in Neurology, Neurophysiology and Neuroimaging  [facility] [address], [place], [place] [address] [phone]

## 2015-01-01 ENCOUNTER — Telehealth: Payer: Self-pay | Admitting: Diagnostic Neuroimaging

## 2015-01-01 NOTE — Telephone Encounter (Signed)
Sister/Guardian Todd Padilla called, stated nursing home advised her that patient is having more frequent seizures and they're lasting longer. When he came to from the seizure today around 11:30am, he was complaining of chest pain, headache and jaw pain. Sister does note that he grits his teeth really bad. He also currently has a UTI, has been treated for this x 3-4 days. Please call [name] on her cell phone [phone].

## 2015-01-08 NOTE — Telephone Encounter (Signed)
Called Sister/Guardian on 01/01/15, no answer. Tried to reach sister again today, no answer.

## 2015-01-08 NOTE — Telephone Encounter (Signed)
Spoke with [name] who apologized for not calling back. She states patient has completed tx for UTI, is much better and not having seizures. She states "let's keep his FU in Dec for now, and I'll call you back if I need to." she verbalized appreciation for call.

## 2015-05-13 ENCOUNTER — Ambulatory Visit: Payer: Medicare Other | Admitting: Diagnostic Neuroimaging

## 2015-06-22 ENCOUNTER — Ambulatory Visit (INDEPENDENT_AMBULATORY_CARE_PROVIDER_SITE_OTHER): Payer: Medicare Other | Admitting: Diagnostic Neuroimaging

## 2015-06-22 ENCOUNTER — Encounter: Payer: Self-pay | Admitting: Diagnostic Neuroimaging

## 2015-06-22 VITALS — BP 119/63 | HR 96

## 2015-06-22 DIAGNOSIS — G40909 Epilepsy, unspecified, not intractable, without status epilepticus: Secondary | ICD-10-CM

## 2015-06-22 NOTE — Progress Notes (Signed)
[facility]  PATIENT: Todd Padilla DOB: Jun 05, 1950  REFERRING CLINICIAN: Inda Merlin  HISTORY FROM: patient and sister REASON FOR VISIT: follow up   HISTORICAL  CHIEF COMPLAINT:  Chief Complaint  Patient presents with  . Seizure disorder    rm 9, lives at [place], [place]  . Follow-up    6 month    HISTORY OF PRESENT ILLNESS:   UPDATE 06/22/15: Since last visit, 1 more seizure on 01/01/15. Otherwise doing well.   UPDATE 11/11/14: Since last visit, depakote dosing has been increased to 500mg  BID. Now doing better with seizure control (4 sz since last visit; 4/11, 5/21, 5/27, 11/06/14; staring, unable to speak, left leg shaking). Previously 2-3 sz per week. More sleepiness in AM.   PRIOR HPI (08/12/14): 65 year old right-handed male here for evaluation of seizures. Patient has history of mental retardation, intellectual disability, seizure disorder. First seizure was at less than 89 years old with shaking convulsions. He may have had encephalitis as a newborn. In 2005 patient was started on primidone for essential tremor. 2010 patient had generalized convulsions, collapse, without incontinence or tongue biting. He was tired and confused thereafter. Patient was started on Depakote. Current seizure frequency is unclear. Recently staff at his facility have noted several breakthrough seizures in which patient had some staring and unresponsiveness without losing consciousness or collapsing. Patient having more anxiety, agitation, compulsive behavior, crying, shaking with anxiety. Behavioral outbursts and issues seem to be increasing over time. Over the past 8 years patient has had generalized weakness, especially in his left leg.    REVIEW OF SYSTEMS: Full 14 system review of systems performed and notable only for decr activity diff urinating agitation confusion depression seizure depression.   ALLERGIES: No Known Allergies  HOME MEDICATIONS: Outpatient  Prescriptions Prior to Visit  Medication Sig Dispense Refill  . acetaminophen (TYLENOL) 325 MG tablet Take 650 mg by mouth every 4 (four) hours as needed for fever or headache.    Marland Kitchen atorvastatin (LIPITOR) 80 MG tablet Take 80 mg by mouth at bedtime.     . chlorhexidine (PERIDEX) 0.12 % solution Use as directed 15 mLs in the mouth or throat 2 (two) times daily. Hold 30 seconds    . Cholecalciferol 50000 UNITS TABS Take 50,000 Units by mouth every 30 (thirty) days.    . clonazePAM (KLONOPIN) 0.5 MG tablet Take 0.5 mg by mouth at bedtime.     . divalproex (DEPAKOTE) 500 MG DR tablet Take 500 mg by mouth 2 (two) times daily.     Marland Kitchen docusate sodium (COLACE) 100 MG capsule Take 100 mg by mouth daily.     . Fluvoxamine Maleate 150 MG CP24 Take 1 capsule by mouth 2 (two) times daily.    . INVOKANA 300 MG TABS tablet Take 300 mg by mouth daily before breakfast.     . JENTADUETO 2.5-500 MG TABS Take 1 tablet by mouth 2 (two) times daily.     Marland Kitchen LANTUS 100 UNIT/ML injection Inject 30 Units into the skin at bedtime. 15 units subq every AM 30 units subq every PM    . lisinopril (PRINIVIL,ZESTRIL) 2.5 MG tablet Take 2.5 mg by mouth daily.    Marland Kitchen LORazepam (ATIVAN) 1 MG tablet Take 2 mg by mouth every 6 (six) hours as needed for anxiety.     . Multiple Vitamin (MULTIVITAMIN WITH MINERALS) TABS tablet Take 1 tablet by mouth daily.    Vladimir Faster Glycol-Propyl Glycol 0.4-0.3 % SOLN Apply 1 drop to eye  2 (two) times daily.    . primidone (MYSOLINE) 50 MG tablet Take 50 mg by mouth at bedtime.     Marland Kitchen QUEtiapine Fumarate (SEROQUEL XR) 150 MG 24 hr tablet Take 150 mg by mouth 2 (two) times daily.    Marland Kitchen ZETIA 10 MG tablet Take 10 mg by mouth daily.     . diphenhydrAMINE (BENYLIN) 12.5 MG/5ML syrup Take 12.5 mg by mouth 4 (four) times daily as needed for allergies. Reported on 06/22/2015    . fenofibrate (TRICOR) 145 MG tablet Take 145 mg by mouth daily. Not on MAR from nursing home    . fish oil-omega-3 fatty acids 1000  MG capsule Take 1 g by mouth daily. Not on MAR from nursing home    . ketoconazole (NIZORAL) 2 % shampoo Apply 1 application topically 2 (two) times a week. Not on MAR from nursing home    . polyethylene glycol (MIRALAX / GLYCOLAX) packet Take 17 g by mouth daily.    Marland Kitchen saccharomyces boulardii (FLORASTOR) 250 MG capsule Take 250 mg by mouth every morning. Not on MAR from nursing home    . Salicylic Acid (DENOREX EXTRA STRENGTH) 3 % SHAM Apply 1 application topically 2 (two) times a week. Tues and Thurs only  Not on MAR from nursing home    . sitaGLIPtan-metformin (JANUMET) 50-1000 MG per tablet Take 1 tablet by mouth 2 (two) times daily with a meal. Not on MAR from nursing home    . tobramycin-dexamethasone (TOBRADEX) ophthalmic solution Reported on 06/22/2015    . LORazepam (ATIVAN) 2 MG/ML concentrated solution Take 1 mg by mouth every 6 (six) hours as needed for anxiety.     No facility-administered medications prior to visit.    PAST MEDICAL HISTORY: Past Medical History  Diagnosis Date  . Hypertension   . Depression   . Diabetes mellitus without complication ([place])   . Headache(784.0)   . Cancer ([place])   . Arthritis   . Mental retardation since birth  . Seizures ([place])     last sz 01/01/15, possibly another 1 week later    PAST SURGICAL HISTORY: Past Surgical History  Procedure Laterality Date  . Hernia repair      FAMILY HISTORY: Family History  Problem Relation Age of Onset  . Stroke Father   . Stroke Other     fathers brother  . Stroke Other     fathers sister    SOCIAL HISTORY:  Social History   Social History  . Marital Status: Married    Spouse Name: N/A  . Number of Children: 0  . Years of Education: na   Occupational History  . disabled    Social History Main Topics  . Smoking status: Never Smoker   . Smokeless tobacco: Never Used  . Alcohol Use: No  . Drug Use: No  . Sexual Activity: No   Other Topics Concern  . Not on file   Social History  Narrative   Lives in nursing home    Wheelchair bound   Drinks caffeine daily 1-2 cups      PHYSICAL EXAM  Filed Vitals:   06/22/15 1400  BP: 119/63  Pulse: 96    There is no weight on file to calculate BMI.  No exam data present  No flowsheet data found.  GENERAL EXAM: Patient is in no distress; well developed, nourished and groomed; neck is supple  CARDIOVASCULAR: Regular rate and rhythm, no murmurs, no carotid bruits  NEUROLOGIC: MENTAL STATUS: awake,  alert, DECR MEMORY, ATTENTION, FLUENCY; FOLLOWS SIMPLE COMMANDS. NAMES SIMPLE OBJECTS. DECR FUND OF KNOWLEDGE. MILD-MOD MENTAL RETARDATION. SLOW RESPONSES. CRANIAL NERVE: pupils equal and reactive to light, visual fields full to confrontation, extraocular muscles intact, no nystagmus, facial sensation and strength symmetric, hearing intact, palate elevates symmetrically, uvula midline, shoulder shrug symmetric, tongue midline. MOTOR: normal bulk and tone; BUE 4; RLE 3-4, LLE 2-3; LEFT ARM POSTURAL TREMOR SENSORY: normal and symmetric to light touch; DECR VIB AT TOES/ANKLES COORDINATION: finger-nose-finger, fine finger movements normal REFLEXES: BUE 2, KNEES 1, ANKLES 0 GAIT/STATION: IN WHEELCHAIR    DIAGNOSTIC DATA (LABS, IMAGING, TESTING) - I reviewed patient records, labs, notes, testing and imaging myself where available.  Lab Results  Component Value Date   WBC 6.8 04/01/2012   HGB 12.6* 04/01/2012   HCT 40.0 04/01/2012   MCV 82.1 04/01/2012   PLT 255 04/01/2012      Component Value Date/Time   NA 142 04/01/2012 0535   K 4.4 04/01/2012 0535   CL 106 04/01/2012 0535   CO2 27 04/01/2012 0535   GLUCOSE 165* 04/01/2012 0535   BUN 20 04/01/2012 0535   CREATININE 0.63 04/01/2012 0535   CALCIUM 9.5 04/01/2012 0535   PROT 7.8 04/01/2012 0838   ALBUMIN 3.8 04/01/2012 0838   AST 15 04/01/2012 0838   ALT 14 04/01/2012 0838   ALKPHOS 46 04/01/2012 0838   BILITOT 0.5 04/01/2012 0838   GFRNONAA >90 04/01/2012  0535   GFRAA >90 04/01/2012 0535   No results found for: CHOL, HDL, LDLCALC, LDLDIRECT, TRIG, CHOLHDL No results found for: HGBA1C Lab Results  Component Value Date   VITAMINB12 650 03/31/2012   Lab Results  Component Value Date   TSH 1.874 03/31/2012    12/03/08 CT head - Atrophy and chronic appearing small vessel changes within the white matter. No acute finding evident.   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     ASSESSMENT AND PLAN  65 y.o. 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 Patient having increasing episodes suspicious for seizure versus behavioral spells. May consider increasing Depakote for better control of mood, behavior and seizure. Appreciate staff maintaining separate seizure log to monitor these events (description and frequency).  PLAN: - CONTINUE depakote 500mg  BID  - continue seizure log to track frequency and details of seizures (per SNF staff)  Return in about 1 year (around 06/21/2016).    Penni Bombard, MD [id number], [id number] PM Certified in Neurology, Neurophysiology and Neuroimaging  [facility] [address] [place],  [address] [phone]

## 2016-06-20 ENCOUNTER — Ambulatory Visit: Payer: Medicare Other | Admitting: Diagnostic Neuroimaging

## 2016-08-29 ENCOUNTER — Encounter (INDEPENDENT_AMBULATORY_CARE_PROVIDER_SITE_OTHER): Payer: Self-pay

## 2016-08-29 ENCOUNTER — Ambulatory Visit (INDEPENDENT_AMBULATORY_CARE_PROVIDER_SITE_OTHER): Payer: Medicare Other | Admitting: Diagnostic Neuroimaging

## 2016-08-29 ENCOUNTER — Encounter: Payer: Self-pay | Admitting: Diagnostic Neuroimaging

## 2016-08-29 VITALS — BP 100/56 | HR 104 | Ht 70.0 in

## 2016-08-29 DIAGNOSIS — F79 Unspecified intellectual disabilities: Secondary | ICD-10-CM

## 2016-08-29 DIAGNOSIS — G40909 Epilepsy, unspecified, not intractable, without status epilepticus: Secondary | ICD-10-CM

## 2016-08-29 NOTE — Progress Notes (Signed)
[facility]  PATIENT: Todd Padilla DOB: April 11, 1951  REFERRING CLINICIAN: Inda Merlin  HISTORY FROM: patient and sister REASON FOR VISIT: follow up   HISTORICAL  CHIEF COMPLAINT:  Chief Complaint  Patient presents with  . Follow-up  . Seizures    HISTORY OF PRESENT ILLNESS:   UPDATE 08/29/16: Since last visit, no seizures. Overall doing well. Tolerating medications.   UPDATE 06/22/15: Since last visit, 1 more seizure on 01/01/15. Otherwise doing well.   UPDATE 11/11/14: Since last visit, depakote dosing has been increased to 500mg  BID. Now doing better with seizure control (4 sz since last visit; 4/11, 5/21, 5/27, 11/06/14; staring, unable to speak, left leg shaking). Previously 2-3 sz per week. More sleepiness in AM.   PRIOR HPI (08/12/14): 66 year old right-handed male here for evaluation of seizures. Patient has history of mental retardation, intellectual disability, seizure disorder. First seizure was at less than 36 years old with shaking convulsions. He may have had encephalitis as a newborn. In 2005 patient was started on primidone for essential tremor. 2010 patient had generalized convulsions, collapse, without incontinence or tongue biting. He was tired and confused thereafter. Patient was started on Depakote. Current seizure frequency is unclear. Recently staff at his facility have noted several breakthrough seizures in which patient had some staring and unresponsiveness without losing consciousness or collapsing. Patient having more anxiety, agitation, compulsive behavior, crying, shaking with anxiety. Behavioral outbursts and issues seem to be increasing over time. Over the past 8 years patient has had generalized weakness, especially in his left leg.   REVIEW OF SYSTEMS: Full 14 system review of systems performed and notable only for decr activity diff urinating agitation confusion depression seizure depression.   ALLERGIES: No Known Allergies  HOME  MEDICATIONS: Outpatient Medications Prior to Visit  Medication Sig Dispense Refill  . atorvastatin (LIPITOR) 80 MG tablet Take 80 mg by mouth at bedtime.     . chlorhexidine (PERIDEX) 0.12 % solution Use as directed 15 mLs in the mouth or throat 2 (two) times daily. Hold 30 seconds    . Cholecalciferol 50000 UNITS TABS Take 50,000 Units by mouth every 30 (thirty) days.    . Cranberry 425 MG CAPS Take 450 mg by mouth 2 (two) times daily.    . divalproex (DEPAKOTE) 500 MG DR tablet Take 500 mg by mouth 2 (two) times daily.     Marland Kitchen docusate sodium (COLACE) 100 MG capsule Take 100 mg by mouth daily.     . INVOKANA 300 MG TABS tablet Take 300 mg by mouth daily before breakfast.     . JENTADUETO 2.5-500 MG TABS Take 1 tablet by mouth 2 (two) times daily.     Marland Kitchen LANTUS 100 UNIT/ML injection Inject 30 Units into the skin at bedtime. 40 units subq every AM 30 units subq every PM    . lisinopril (PRINIVIL,ZESTRIL) 2.5 MG tablet Take 2.5 mg by mouth daily.    . Multiple Vitamin (MULTIVITAMIN WITH MINERALS) TABS tablet Take 1 tablet by mouth daily.    Vladimir Faster Glycol-Propyl Glycol 0.4-0.3 % SOLN Apply 1 drop to eye 2 (two) times daily.    . primidone (MYSOLINE) 50 MG tablet Take 50 mg by mouth 4 (four) times daily.     . QUEtiapine Fumarate (SEROQUEL XR) 150 MG 24 hr tablet Take 150 mg by mouth 2 (two) times daily.    Marland Kitchen ZETIA 10 MG tablet Take 10 mg by mouth daily.     Marland Kitchen acetaminophen (TYLENOL) 325 MG tablet  Take 650 mg by mouth every 4 (four) hours as needed for fever or headache.    . diphenhydrAMINE (BENYLIN) 12.5 MG/5ML syrup Take 12.5 mg by mouth 4 (four) times daily as needed for allergies. Reported on 06/22/2015    . Salicylic Acid (DENOREX EXTRA STRENGTH) 3 % SHAM Apply 1 application topically 2 (two) times a week. Tues and Thurs only  Not on MAR from nursing home    . clonazePAM (KLONOPIN) 0.5 MG tablet Take 0.5 mg by mouth at bedtime.     . fenofibrate (TRICOR) 145 MG tablet Take 145 mg by  mouth daily. Not on MAR from nursing home    . fish oil-omega-3 fatty acids 1000 MG capsule Take 1 g by mouth daily. Not on MAR from nursing home    . Fluvoxamine Maleate 150 MG CP24 Take 1 capsule by mouth 2 (two) times daily.    Marland Kitchen ketoconazole (NIZORAL) 2 % shampoo Apply 1 application topically 2 (two) times a week. Not on MAR from nursing home    . LORazepam (ATIVAN) 1 MG tablet Take 2 mg by mouth every 6 (six) hours as needed for anxiety.     . polyethylene glycol (MIRALAX / GLYCOLAX) packet Take 17 g by mouth daily.    Marland Kitchen saccharomyces boulardii (FLORASTOR) 250 MG capsule Take 250 mg by mouth every morning. Not on MAR from nursing home    . sitaGLIPtan-metformin (JANUMET) 50-1000 MG per tablet Take 1 tablet by mouth 2 (two) times daily with a meal. Not on MAR from nursing home    . tobramycin-dexamethasone (TOBRADEX) ophthalmic solution Reported on 06/22/2015     No facility-administered medications prior to visit.     PAST MEDICAL HISTORY: Past Medical History:  Diagnosis Date  . Arthritis   . Cancer ([place])   . Depression   . Diabetes mellitus without complication ([place])   . Headache(784.0)   . Hypertension   . Mental retardation since birth  . Seizures ([place])    last sz 01/01/15, possibly another 1 week later    PAST SURGICAL HISTORY: Past Surgical History:  Procedure Laterality Date  . HERNIA REPAIR      FAMILY HISTORY: Family History  Problem Relation Age of Onset  . Stroke Father   . Stroke Other     fathers brother  . Stroke Other     fathers sister    SOCIAL HISTORY:  Social History   Social History  . Marital status: Married    Spouse name: N/A  . Number of children: 0  . Years of education: na   Occupational History  . disabled    Social History Main Topics  . Smoking status: Never Smoker  . Smokeless tobacco: Never Used  . Alcohol use No  . Drug use: No  . Sexual activity: No   Other Topics Concern  . Not on file   Social History Narrative    Lives in nursing home, [place].    Wheelchair bound   Drinks caffeine daily 1-2 cups      PHYSICAL EXAM  Vitals:   08/29/16 1116  BP: (!) 100/56  Pulse: (!) 104  Height: 5\' 10"  (1.778 m)    There is no height or weight on file to calculate BMI.  No exam data present  No flowsheet data found.  GENERAL EXAM: Patient is in no distress; well developed, nourished and groomed; neck is supple  CARDIOVASCULAR: Regular rate and rhythm, no murmurs, no carotid bruits  NEUROLOGIC:  MENTAL STATUS: awake, alert, DECR MEMORY, ATTENTION, FLUENCY; FOLLOWS SIMPLE COMMANDS. NAMES SIMPLE OBJECTS. DECR FUND OF KNOWLEDGE. MILD-MOD MENTAL RETARDATION. SLOW RESPONSES. CRANIAL NERVE: pupils equal and reactive to light, visual fields full to confrontation, extraocular muscles intact, no nystagmus, facial sensation and strength symmetric, hearing intact, palate elevates symmetrically, uvula midline, shoulder shrug symmetric, tongue midline. MOTOR: normal bulk and tone; BUE 4; RLE 3-4, LLE 2-3; LEFT ARM POSTURAL TREMOR SENSORY: normal and symmetric to light touch; DECR VIB AT TOES/ANKLES COORDINATION: finger-nose-finger, fine finger movements normal REFLEXES: BUE 2, KNEES 1, ANKLES 0 GAIT/STATION: IN WHEELCHAIR    DIAGNOSTIC DATA (LABS, IMAGING, TESTING) - I reviewed patient records, labs, notes, testing and imaging myself where available.  Lab Results  Component Value Date   WBC 6.8 04/01/2012   HGB 12.6 (L) 04/01/2012   HCT 40.0 04/01/2012   MCV 82.1 04/01/2012   PLT 255 04/01/2012      Component Value Date/Time   NA 142 04/01/2012 0535   K 4.4 04/01/2012 0535   CL 106 04/01/2012 0535   CO2 27 04/01/2012 0535   GLUCOSE 165 (H) 04/01/2012 0535   BUN 20 04/01/2012 0535   CREATININE 0.63 04/01/2012 0535   CALCIUM 9.5 04/01/2012 0535   PROT 7.8 04/01/2012 0838   ALBUMIN 3.8 04/01/2012 0838   AST 15 04/01/2012 0838   ALT 14 04/01/2012 0838   ALKPHOS 46 04/01/2012 0838    BILITOT 0.5 04/01/2012 0838   GFRNONAA >90 04/01/2012 0535   GFRAA >90 04/01/2012 0535   No results found for: CHOL, HDL, LDLCALC, LDLDIRECT, TRIG, CHOLHDL No results found for: HGBA1C Lab Results  Component Value Date   VITAMINB12 650 03/31/2012   Lab Results  Component Value Date   TSH 1.874 03/31/2012    12/03/08 CT head - Atrophy and chronic appearing small vessel changes within the white matter. No acute finding evident.   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     ASSESSMENT AND PLAN  66 y.o. 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 Patient was having increasing episodes suspicious for seizure versus behavioral spells, but now better.    Dx:  Seizure disorder [facility])  Mental retardation    PLAN: I spent 15 minutes of face to face time with patient. Greater than 50% of time was spent in counseling and coordination of care with patient. In summary we discussed:  - CONTINUE depakote 500mg  twice a day for seizure control - continue primidone 50mg  three times per day for essential tremor - continue seizure log to track frequency and details of seizures (per SNF staff) - annual CBC, CMP to monitor for side effects of depakote  Return if symptoms worsen or fail to improve, for return to PCP.    Penni Bombard, MD 11/26/7339, 93:79 PM Certified in Neurology, Neurophysiology and Neuroimaging  [facility] [address], [place], [place] [address] [phone]

## 2021-01-19 ENCOUNTER — Ambulatory Visit: Payer: Medicare Other | Admitting: Orthopaedic Surgery
